# Patient Record
Sex: Male | Born: 1985 | Race: Black or African American | Hispanic: No | Marital: Single | Smoking: Former smoker
Health system: Southern US, Community
[De-identification: ages and names within clinical notes are randomized; demographics above are authoritative.]

## PROBLEM LIST (undated history)

## (undated) DIAGNOSIS — R0989 Other specified symptoms and signs involving the circulatory and respiratory systems: Secondary | ICD-10-CM

## (undated) DIAGNOSIS — I38 Endocarditis, valve unspecified: Secondary | ICD-10-CM

## (undated) HISTORY — PX: OTHER SURGICAL HISTORY: SHX169

---

## 2008-02-01 ENCOUNTER — Emergency Department (HOSPITAL_COMMUNITY): Admission: EM | Admit: 2008-02-01 | Discharge: 2008-02-01 | Payer: Self-pay | Admitting: Emergency Medicine

## 2009-08-31 ENCOUNTER — Emergency Department (HOSPITAL_COMMUNITY): Admission: EM | Admit: 2009-08-31 | Discharge: 2009-08-31 | Payer: Self-pay | Admitting: Emergency Medicine

## 2009-08-31 ENCOUNTER — Encounter: Payer: Self-pay | Admitting: Pulmonary Disease

## 2009-09-17 ENCOUNTER — Ambulatory Visit: Payer: Self-pay | Admitting: Pulmonary Disease

## 2009-09-17 DIAGNOSIS — R0609 Other forms of dyspnea: Secondary | ICD-10-CM

## 2009-09-17 DIAGNOSIS — R0989 Other specified symptoms and signs involving the circulatory and respiratory systems: Secondary | ICD-10-CM

## 2010-07-05 NOTE — Assessment & Plan Note (Signed)
Summary: consult for dyspnea, palpitations, weakness.   Copy to:  Duane Banks Primary Provider/Referring Provider:  Sigmund Banks  CC:  Pulmonary Consult , pt c/o sob and weakness, and some relief with pro-air.  History of Present Illness: The pt is a 25 y/o male who I have been asked to see for dyspnea, weakness, and palpitations.  The pt gives a two week history of "episodes" which consist of palpitations/irregular heartbeats, sob, and overwhelming feeling of weakness to the point he does not even want to stand up.  These have occurred everyday, and multiple times each day since they started.  Normally, he has no dyspnea whatsoever.  He was seen by his primary md, who thought it may related to asthma.  He has a h/o childhood asthma, but has had no issues with it since entering teenage years.  He was put on prednisone, as well as proair and flovent for the last one week.  He tells me that his breathing may be a little better, but has had no change in his palpitations and weakness.  The pt has been seen in the ER, where a cxr was unremarkable.  He denies any pleuritic chest pain or LE edema.  He has had no cough, congestion, or mucus.  Preventive Screening-Counseling & Management  Alcohol-Tobacco     Smoking Status: quit     Packs/Day: 4 cigarettes per day x 4 yrs     Year Started: 2007     Year Quit: 2011  Current Medications (verified): 1)  Aspirin 81 Mg Tabs (Aspirin) .Marland Kitchen.. 1 Once Daily 2)  Flovent Hfa 44 Mcg/act Aero (Fluticasone Propionate  Hfa) .... 2 Puffs Two Times A Day 3)  Tylenol 325 Mg Tabs (Acetaminophen) .Marland Kitchen.. 1 As Needed 4)  Proair Hfa 108 (90 Base) Mcg/act Aers (Albuterol Sulfate) .... 2 Puffs Every 4 Hrs As Needed  Allergies (verified): No Known Drug Allergies  Past History:  Past Medical History: Hypertension Headaches  Past Surgical History: none per pt  Family History: Reviewed history and no changes required. Maternal grandmother heart disease  Social  History: Reviewed history and no changes required. Single with 1 child Unemployed Former smoker quit 1 month ago Smoked marijuana x 2 years quit 2008 Alcohol 2 drinks per day Smoking Status:  quit Packs/Day:  4 cigarettes per day x 4 yrs  Review of Systems       The patient complains of shortness of breath with activity, shortness of breath at rest, chest pain, irregular heartbeats, acid heartburn, loss of appetite, weight change, sore throat, headaches, nasal congestion/difficulty breathing through nose, and anxiety.  The patient denies productive cough, non-productive cough, coughing up blood, indigestion, abdominal pain, difficulty swallowing, tooth/dental problems, sneezing, itching, ear ache, depression, hand/feet swelling, joint stiffness or pain, rash, change in color of mucus, and fever.    Vital Signs:  Patient profile:   25 year old male Height:      75 inches Weight:      212 pounds BMI:     26.59 O2 Sat:      97 % on Room air Temp:     98.4 degrees F oral Pulse rate:   83 / minute BP sitting:   110 / 70  (left arm)  Vitals Entered By: Renold Genta RCP, LPN (September 17, 2009 2:05 PM)  O2 Sat at Rest %:  97% O2 Flow:  Room air CC: Pulmonary Consult , pt c/o sob and weakness, some relief with pro-air Comments Medications reviewed with  patient Renold Genta RCP, LPN  September 17, 2009 2:05 PM    Physical Exam  General:  wd male in nad Eyes:  PERRLA and EOMI.   Nose:  patent without discharge Mouth:  clear, no exudates Neck:  no jvd, tmg , LN Lungs:  totally clear to auscultation Heart:  rrr, no mrg Abdomen:  soft and nontender, bs+ Extremities:  no edema or cyanosis pulses intact distally Neurologic:  alert and oriented, moves all 4.   Impression & Recommendations:  Problem # 1:  OTHER DYSPNEA AND RESPIRATORY ABNORMALITIES (ICD-786.09)  I am unable to explain the pt's symptoms currently.  His chest is clear to auscultation, his spirometry is normal, and his  cxr 2 weeks ago was totally clear.  A lot of his symptoms are nonpulmonary, and therefore not related to asthma.  I am unable to tell him with 100% certainty that he does or does not have asthma, but I think is unlikely.  He recently was treated with a course of prednisone, and has been taking flovent intermittantly.  This can convert reversible airflow obstruction to normal.  I think he needs to come off all pulmonary meds except as needed proair.  If he develops worsening sob, can recheck spirometry off meds to verify whether he has airflow obstruction.  Would consider whether to check echo and holter for completeness, given that his symptoms sound more cardiac than pulmonary.  The other consideration is whether he may be having occult reflux or anxiety driving all of this.  Will leave this investigation to his primary MD.  Medications Added to Medication List This Visit: 1)  Celexa 10 Mg Tabs (Citalopram hydrobromide) .Marland Kitchen.. 1 once daily  Other Orders: Consultation Level IV (45409) Spirometry w/Graph (81191)  Patient Instructions: 1)  stop flovent, can use proair as needed. 2)  if your breathing worsens off all meds, I am happy to get you into the office to repeat your spirometry. 3)  stop all smoking 100%. 4)  will send a note to your primary outlining my findiings.   [facility] Spirometry  ID: [id number] [name], Duane Banks DOB: [birth year] Age: 25 Years Old Sex: Male Race: Black Height: 75 Weight: 212 Smoker: No PPD: 4 cigarettes per day x 4 yrs Status: Unconfirmed Recorded: 09/17/2009 2:53 PM  Parameter  Measured Predicted %Predicted FVC     4.97        5.46        91.10 FEV1     4.19        4.60        91.20 FEV1%   84.29        85.22        98.90 PEF    10.60        11.19        94.70   Interpretation: spirometry is normal

## 2010-08-28 LAB — POCT CARDIAC MARKERS: CKMB, poc: <1 ng/mL — ABNORMAL LOW (ref 1.0–8.0)

## 2010-08-28 LAB — CBC
HCT: 48.7 % (ref 39.0–52.0)
Hemoglobin: 16.2 g/dL (ref 13.0–17.0)
MCV: 81.9 fL (ref 78.0–100.0)
Platelets: 252 10*3/uL (ref 150–400)
RDW: 14.6 % (ref 11.5–15.5)

## 2010-08-28 LAB — BASIC METABOLIC PANEL
BUN: 9 mg/dL (ref 6–23)
CO2: 25 mEq/L (ref 19–32)
GFR calc Af Amer: >60 mL/min (ref >60)
GFR calc non Af Amer: >60 mL/min (ref >60)
Glucose, Bld: 108 mg/dL — ABNORMAL HIGH (ref 70–99)

## 2011-01-12 ENCOUNTER — Emergency Department (HOSPITAL_COMMUNITY): Payer: Self-pay

## 2011-01-12 ENCOUNTER — Emergency Department (HOSPITAL_COMMUNITY)
Admission: EM | Admit: 2011-01-12 | Discharge: 2011-01-13 | Disposition: A | Discharge status: Home / Self Care | Payer: Self-pay | Attending: Emergency Medicine | Admitting: Emergency Medicine

## 2011-01-12 DIAGNOSIS — S62639B Displaced fracture of distal phalanx of unspecified finger, initial encounter for open fracture: Secondary | ICD-10-CM | POA: Insufficient documentation

## 2011-01-12 DIAGNOSIS — S61209A Unspecified open wound of unspecified finger without damage to nail, initial encounter: Secondary | ICD-10-CM | POA: Insufficient documentation

## 2011-01-12 DIAGNOSIS — W230XXA Caught, crushed, jammed, or pinched between moving objects, initial encounter: Secondary | ICD-10-CM | POA: Insufficient documentation

## 2011-08-06 ENCOUNTER — Ambulatory Visit (INDEPENDENT_AMBULATORY_CARE_PROVIDER_SITE_OTHER): Payer: BC Managed Care – PPO | Admitting: Internal Medicine

## 2011-08-06 VITALS — BP 128/78 | HR 61 | Temp 98.4°F | Resp 16 | Ht 75.0 in | Wt 198.0 lb

## 2011-08-06 DIAGNOSIS — H109 Unspecified conjunctivitis: Secondary | ICD-10-CM

## 2011-08-06 DIAGNOSIS — H1030 Unspecified acute conjunctivitis, unspecified eye: Secondary | ICD-10-CM

## 2011-08-06 MED ORDER — CIPROFLOXACIN HCL 0.3 % OP SOLN: 2.0000 [drp] | OPHTHALMIC | Status: AC

## 2011-08-06 NOTE — Progress Notes (Signed)
  Subjective:    Patient ID: Duane Banks, male    DOB: [birth year], 26 y.o.   MRN: [id number]  HPI Red eye on right 3 days itches some crusty d/c in am. No foreighn body. No contacts. No visual disturbance   Review of Systems  Constitutional: Negative.   HENT: Negative.   Eyes: Positive for discharge, redness and itching.  Respiratory: Negative.   Cardiovascular: Negative.   Gastrointestinal: Negative.   Genitourinary: Negative.   Musculoskeletal: Negative.   Skin: Negative.   Neurological: Negative.   Hematological: Negative.   Psychiatric/Behavioral: Negative.        Objective:   Physical Exam  Constitutional: He is oriented to person, place, and time. He appears well-developed and well-nourished.  HENT:  Head: Normocephalic and atraumatic.       Conjunctivitis r eye  Neck: Normal range of motion.  Cardiovascular: Normal rate and normal heart sounds.   Pulmonary/Chest: Effort normal and breath sounds normal.  Abdominal: Soft. Bowel sounds are normal.  Musculoskeletal: Normal range of motion.  Neurological: He is alert and oriented to person, place, and time.  Skin: Skin is warm and dry.  Psychiatric: He has a normal mood and affect. His behavior is normal. Judgment and thought content normal.          Assessment & Plan:  Conjunctivitis r eye...ciloxin and public health issues

## 2011-08-08 ENCOUNTER — Ambulatory Visit (INDEPENDENT_AMBULATORY_CARE_PROVIDER_SITE_OTHER): Payer: BC Managed Care – PPO | Admitting: Family Medicine

## 2011-08-08 DIAGNOSIS — H109 Unspecified conjunctivitis: Secondary | ICD-10-CM

## 2011-08-08 DIAGNOSIS — H00039 Abscess of eyelid unspecified eye, unspecified eyelid: Secondary | ICD-10-CM

## 2011-08-08 DIAGNOSIS — H5710 Ocular pain, unspecified eye: Secondary | ICD-10-CM

## 2011-08-08 DIAGNOSIS — H5712 Ocular pain, left eye: Secondary | ICD-10-CM

## 2011-08-08 DIAGNOSIS — L03213 Periorbital cellulitis: Secondary | ICD-10-CM

## 2011-08-08 LAB — POCT CBC
HCT, POC: 43.4 % — AB (ref 43.5–53.7)
Lymph, poc: 2.4 (ref 0.6–3.4)
Platelet Count, POC: 275 10*3/uL (ref 142–424)
RBC: 5.57 M/uL (ref 4.69–6.13)
RDW, POC: 14.7 %
WBC: 5 10*3/uL (ref 4.6–10.2)

## 2011-08-08 MED ORDER — AMOXICILLIN 875 MG PO TABS: 875.0000 mg | ORAL_TABLET | Freq: Two times a day (BID) | ORAL | Status: AC

## 2011-08-08 MED ORDER — SULFAMETHOXAZOLE-TRIMETHOPRIM 800-160 MG PO TABS: 1.0000 | ORAL_TABLET | Freq: Two times a day (BID) | ORAL | Status: AC

## 2011-08-08 NOTE — Progress Notes (Signed)
Subjective:    Patient ID: Duane Banks, male    DOB: 01/05/1986, 26 y.o.   MRN: [id number]  HPI Duane Banks is a 26 y.o. male Diagnosed with R eye conjunctivitis 2 days ago - started on Ciloxan gtts.  Just red then., but now more swelling around eye in am.  Irritated and red only  initailly, but eye swollen yesterday am.   using ciloxan gtts every 2 hours yesterday.  Less sore, but more swollen.  No known fever past few days.  Lump in front of R ear.  Cold symptoms over past week.  Congestion and runny nose. Blurry in R eye, past couple of days.  No known FB or scratching of eye.    Tx: otc congestion/cough medicine. +sick contact with pink eye 2 weeks ago. Cigars occasionally.    Review of Systems  Constitutional: Negative for fever and chills.  HENT: Positive for congestion and rhinorrhea. Negative for ear pain and ear discharge.   Eyes: Positive for pain, discharge, redness and visual disturbance.       Watery with green mucus, during day as well. Past few days.  Neurological: Positive for headaches.  Hematological: Positive for adenopathy.       In front of R ear.       Objective:   Physical Exam  Constitutional: He is oriented to person, place, and time. He appears well-developed and well-nourished. No distress.  HENT:  Head: Normocephalic and atraumatic.    Right Ear: Hearing, tympanic membrane, external ear and ear canal normal.  Left Ear: Hearing, tympanic membrane, external ear and ear canal normal.       R preauricular node ttp/enlarged.  Eyes: EOM are normal. Pupils are equal, round, and reactive to light. Right eye exhibits discharge. Right eye exhibits no hordeolum. No foreign body present in the right eye. Left eye exhibits no discharge and no exudate. Right conjunctiva is injected. Left conjunctiva is not injected.       Injected R sclera with slight mucopurulent d/c at [place].  Anterior chamber clear.  Upper and lower lids edematous/erythematous, with R  infraorbital erythema.  EOM unrestricted and not painful.   See photo.  Neck: Neck supple.  Cardiovascular: Normal rate and intact distal pulses.   No murmur heard. Pulmonary/Chest: Effort normal.  Lymphadenopathy:    He has no cervical adenopathy.  Neurological: He is oriented to person, place, and time.  Skin: Skin is warm and dry.  Psychiatric: He has a normal mood and affect. His behavior is normal.   Results for orders placed in visit on 08/08/11  POCT CBC      Component Value Range   WBC 5.0  4.6 - 10.2 (K/uL)   Lymph, poc 2.4  0.6 - 3.4    POC LYMPH PERCENT 47.8  10 - 50 (%L)   MID (cbc) 0.3  0 - 0.9    POC MID % 6.7  0 - 12 (%M)   POC Granulocyte 2.3  2 - 6.9    Granulocyte percent 45.5  37 - 80 (%G)   RBC 5.57  4.69 - 6.13 (M/uL)   Hemoglobin 14.2  14.1 - 18.1 (g/dL)   HCT, POC 40.9 (*) 81.1 - 53.7 (%)   MCV 77.9 (*) 80 - 97 (fL)   MCH, POC 25.5 (*) 27 - 31.2 (pg)   MCHC 32.7  31.8 - 35.4 (g/dL)   RDW, POC 91.4     Platelet Count, POC 275  142 - 424 (  K/uL)   MPV 8.9  0 - 99.8 (fL)         Assessment & Plan:  R conjunctivitis with probable secondary preseptal cellulitis.  Reassuring CBC,  And extraocular muscle exam, doubt orbital cellulitis.  visual acuity equal. Culture obtained of discharge at [place]. Continue ciloxan gtts q 2 hours. Start septra ds bid #20 Start amoxicillin 875mg  BID # 20. Recheck tomorrow morning.  If any worsening, or worsening sooner, consider orbital CT.

## 2011-08-08 NOTE — Patient Instructions (Signed)
Continue eye drops as previously prescribed.  Start nboth new antibiotics today.  Recheck with me tomorrow morning  Return to the clinic or go to the nearest emergency room if any of your symptoms worsen or new symptoms occur.

## 2011-08-09 ENCOUNTER — Ambulatory Visit (INDEPENDENT_AMBULATORY_CARE_PROVIDER_SITE_OTHER): Payer: BC Managed Care – PPO | Admitting: Family Medicine

## 2011-08-09 VITALS — BP 96/54 | HR 73 | Temp 98.5°F | Resp 16 | Ht 74.0 in | Wt 194.2 lb

## 2011-08-09 DIAGNOSIS — H5711 Ocular pain, right eye: Secondary | ICD-10-CM

## 2011-08-09 DIAGNOSIS — H5710 Ocular pain, unspecified eye: Secondary | ICD-10-CM

## 2011-08-09 DIAGNOSIS — H109 Unspecified conjunctivitis: Secondary | ICD-10-CM

## 2011-08-09 NOTE — Patient Instructions (Signed)
Your eye infection is possibly due to a virus, which will improve with symptomatic care and time.  However, to cover for possible bacteria, can continue antibiotic drops, and amoxicillin.  Stop Septra at this point.  If not improving in next 2 days, return for recheck as may need to be evaluated by ophthalmologist.  Return to the clinic or go to the nearest emergency room if any of your symptoms worsen or new symptoms occur.

## 2011-08-09 NOTE — Progress Notes (Signed)
  Subjective:    Patient ID: AIDYN KELLIS, male    DOB: 1985/12/30, 26 y.o.   MRN: [id number]  Saladin Petrelli Bubb is a 26 y.o. male  Diagnosed with R eye conjunctivitis 3 days ago - started on Ciloxan gtts.  Initially just red eye, but more swelling around eye past 2 days.  Seen yesterday - dx with early preseptal cellulitis.  Continued Ciloxan gtts., added Septra, and Amoxicillin.   Here for follow up.Eye still swollen, no fever, tolerating antibiotics.  S/p 3 doses.  Redness persisted around an in eye.  Not much change. Less irritation with drops only.   Review of Systems  Constitutional: Negative for fever and chills.  HENT: Positive for facial swelling and rhinorrhea. Negative for hearing loss, ear pain and ear discharge.        Feels like R side of face near eye is swollen after OV yesterday.   Eyes: Positive for discharge and redness. Negative for photophobia, pain and itching.       Watery, with slight matting this am, no green d/c noted today.  Slight glare on right eye since onset of eye sx's.  Hematological: Positive for adenopathy.       Objective:   Physical Exam  Nursing note and vitals reviewed. Constitutional: He appears well-developed and well-nourished.  HENT:  Head: Normocephalic and atraumatic.  Right Ear: External ear normal.  Left Ear: External ear normal.  Eyes: EOM are normal. Pupils are equal, round, and reactive to light. Right conjunctiva is injected.       Superior and inferior R lid edema/erythema with small amount lateral sts (appears same as yesterday)  Slight matting on lids, no discharge noted.  No proptosis, no hyphema- anterior chamber clear.  R preauricular node enlarged.  Pulmonary/Chest: Effort normal.  Skin: Skin is warm and dry. There is erythema.  Psychiatric: He has a normal mood and affect. His behavior is normal.    2nd MD exam performed.    Assessment & Plan:   DARON STUTZ is a 26 y.o. male Conjunctivitis, with probable  keratoconjunctivitis.  uri sx's of congestion/rhinorhea, afebrile, and normal CBC yesterday, viral etiology probable - including Adnenovirus with preauricular lymphadenopathy.   Ok to continue ciloxan and amoxicillin, but can stop septra.  Cont sx care with compresses prn, tylenol prn, oow x 2 days if needed. If not improved in 48 hours, may need optho eval.

## 2011-08-11 ENCOUNTER — Ambulatory Visit (INDEPENDENT_AMBULATORY_CARE_PROVIDER_SITE_OTHER): Payer: BC Managed Care – PPO | Admitting: Family Medicine

## 2011-08-11 VITALS — BP 108/69 | HR 81 | Temp 99.0°F | Resp 16 | Ht 74.0 in | Wt 194.0 lb

## 2011-08-11 DIAGNOSIS — H5789 Other specified disorders of eye and adnexa: Secondary | ICD-10-CM

## 2011-08-11 DIAGNOSIS — H539 Unspecified visual disturbance: Secondary | ICD-10-CM

## 2011-08-11 DIAGNOSIS — H5710 Ocular pain, unspecified eye: Secondary | ICD-10-CM

## 2011-08-11 DIAGNOSIS — H2000 Unspecified acute and subacute iridocyclitis: Secondary | ICD-10-CM

## 2011-08-11 DIAGNOSIS — H5711 Ocular pain, right eye: Secondary | ICD-10-CM

## 2011-08-11 NOTE — Progress Notes (Signed)
  Subjective:    Patient ID: Duane Banks, male    DOB: [birth year], 26 y.o.   MRN: [id number]  HPI With continued right eye redness, pain and leg swelling. Seen initially on 08/06/2011 and diagnosed with bacterial conjunctivitis. Started on Ciloxandrops. He developed swelling of the lids of the right eye and returned on 08/08/2011 where he was diagnosed with early preseptal cellulitis and Septra and amoxicillin oral tablets were added. He returned for reevaluation on 08/09/2011 with worsening swelling of the lid. Septra was discontinued. He was advised to return in 48 hours if he had not had significant improvement. He returns today reporting minimal decrease in the swelling and irritation. I do still red. Now he is also having irritation and developing redness in the left eye. There gave him a hepatic drop that contains belladonna-he used it yesterday but isn't sure if it may change in his symptoms. Notes that his vision seems darker on the right.   Review of Systems As above.    Objective:   Physical Exam Vital signs are noted. Vision has now decreased in the right eye. He remained stable on the left.sclera and conjunctiva are injected. The lid is swollen but without significant erythema. There is crusting on the lashes. Pupil is equal, round and reactive to light. Extraocular movements are intact. No hyphema. The anterior chamber is clear. Funduscopic exam is unremarkable. No palpable lymphadenopathy-he had preauricular lymphadenopathy on previous exams.       Assessment & Plan:  Eye pain, redness, decreased visual acuity, iritis.  To see ophthalmology today.  Evaluated with Dr. Hal Hope.

## 2011-08-11 NOTE — Patient Instructions (Addendum)
Continue the Ciloxan eye drops. Proceed to the appointment with the ophthalmologist today. Dr. Laruth Bouchard office is on [facility] [address] across from the visitor parking deck at [facility].

## 2012-04-24 ENCOUNTER — Emergency Department (HOSPITAL_COMMUNITY)
Admission: EM | Admit: 2012-04-24 | Discharge: 2012-04-24 | Disposition: A | Discharge status: Home / Self Care | Payer: BC Managed Care – PPO | Attending: Emergency Medicine | Admitting: Emergency Medicine

## 2012-04-24 ENCOUNTER — Encounter (HOSPITAL_COMMUNITY): Payer: Self-pay | Admitting: Physical Medicine and Rehabilitation

## 2012-04-24 DIAGNOSIS — A64 Unspecified sexually transmitted disease: Secondary | ICD-10-CM | POA: Insufficient documentation

## 2012-04-24 DIAGNOSIS — Z87891 Personal history of nicotine dependence: Secondary | ICD-10-CM | POA: Insufficient documentation

## 2012-04-24 DIAGNOSIS — R300 Dysuria: Secondary | ICD-10-CM | POA: Insufficient documentation

## 2012-04-24 LAB — URINE MICROSCOPIC-ADD ON

## 2012-04-24 LAB — URINALYSIS, ROUTINE W REFLEX MICROSCOPIC
Bilirubin Urine: NEGATIVE
Glucose, UA: NEGATIVE mg/dL
Ketones, ur: NEGATIVE mg/dL
Protein, ur: NEGATIVE mg/dL
Urobilinogen, UA: 1 mg/dL (ref 0.0–1.0)

## 2012-04-24 MED ORDER — LIDOCAINE HCL (PF) 1 % IJ SOLN
INTRAMUSCULAR | Status: AC
  Filled 2012-04-24: qty 5

## 2012-04-24 MED ORDER — PHENAZOPYRIDINE HCL 200 MG PO TABS: 200.0000 mg | ORAL_TABLET | Freq: Three times a day (TID) | ORAL | Status: DC | PRN

## 2012-04-24 MED ORDER — AZITHROMYCIN 1 G PO PACK
1.0000 g | PACK | Freq: Once | ORAL | Status: AC
  Administered 2012-04-24: 1 g via ORAL
  Filled 2012-04-24: qty 1

## 2012-04-24 MED ORDER — CEFTRIAXONE SODIUM 250 MG IJ SOLR
250.0000 mg | Freq: Once | INTRAMUSCULAR | Status: AC
  Administered 2012-04-24: 250 mg via INTRAMUSCULAR
  Filled 2012-04-24: qty 250

## 2012-04-24 NOTE — ED Notes (Signed)
States last week his ex-girlfriend told him she had Chlamydia-- started having penile d/c Saturday.

## 2012-04-24 NOTE — ED Notes (Signed)
Pt presents to department for evaluation of dysuria and yellow colored penile discharge. Ongoing x4 days. Pt concerned that he has STD. No other complaints. He is alert and oriented x4. No signs of distress.

## 2012-04-24 NOTE — ED Provider Notes (Signed)
History    This chart was scribed for Cheri Guppy, MD, MD by Smitty Pluck, ED Scribe. The patient was seen in room TR10C and the patient's care was started at 2:53PM.   CSN: [id number]  Arrival date & time 04/24/12  1410   None     Chief Complaint  Patient presents with  . Urinary Frequency  . SEXUALLY TRANSMITTED DISEASE    (Consider location/radiation/quality/duration/timing/severity/associated sxs/prior treatment) Patient is a 26 y.o. male presenting with frequency. The history is provided by the patient. No language interpreter was used.  Urinary Frequency Pertinent negatives include no shortness of breath.   Duane Banks is a 26 y.o. male who presents to the Emergency Department complaining of constant, moderate penile discharge onset 4 days ago. Discharge is yellowish. Reports dysuria. Pt reports that he has had chlamydia in the past. He reports that his ex-girlfriend told him she was diagnosed with chlamydia. He has not had sexual contact with her in weeks. Pt denies fever, chills, nausea, vomiting and any other pain.     No past medical history on file.  No past surgical history on file.  History reviewed. No pertinent family history.  History  Substance Use Topics  . Smoking status: Former Smoker    Types: Cigars    Quit date: 08/05/2008  . Smokeless tobacco: Not on file  . Alcohol Use: Yes      Review of Systems  Constitutional: Negative for fever and chills.  Respiratory: Negative for shortness of breath.   Gastrointestinal: Negative for nausea and vomiting.  Genitourinary: Positive for dysuria and discharge.  Neurological: Negative for weakness.  All other systems reviewed and are negative.    Allergies  Review of patient's allergies indicates no known allergies.  Home Medications  No current outpatient prescriptions on file.  BP 145/59  Pulse 85  Temp 97.4 F (36.3 C) (Oral)  Resp 16  SpO2 98%  Physical Exam  Nursing note and  vitals reviewed. Constitutional: He is oriented to person, place, and time. He appears well-developed and well-nourished. No distress.  HENT:  Head: Normocephalic and atraumatic.  Eyes: EOM are normal.  Neck: Neck supple. No tracheal deviation present.  Cardiovascular: Normal rate.   Pulmonary/Chest: Effort normal. No respiratory distress.  Genitourinary: No penile tenderness.       No palpable lymphadenopathy Purulent discharge  Musculoskeletal: Normal range of motion.  Neurological: He is alert and oriented to person, place, and time.  Skin: Skin is warm and dry.  Psychiatric: He has a normal mood and affect. His behavior is normal.    ED Course  Procedures (including critical care time) DIAGNOSTIC STUDIES: Oxygen Saturation is 98% on room air, normal by my interpretation.    COORDINATION OF CARE: 2:57 PM Discussed ED treatment with pt      Labs Reviewed  URINALYSIS, ROUTINE W REFLEX MICROSCOPIC   No results found.   No diagnosis found.    MDM  Std Not immunocompromised No signs systemic illness.  Partner has already been treated      I personally performed the services described in this documentation, which was scribed in my presence. The recorded information has been reviewed and is accurate.    Cheri Guppy, MD 04/24/12 1500

## 2013-01-12 ENCOUNTER — Emergency Department (HOSPITAL_COMMUNITY)
Admission: EM | Admit: 2013-01-12 | Discharge: 2013-01-12 | Disposition: A | Discharge status: Home / Self Care | Payer: Self-pay | Attending: Emergency Medicine | Admitting: Emergency Medicine

## 2013-01-12 ENCOUNTER — Encounter (HOSPITAL_COMMUNITY): Payer: Self-pay | Admitting: Nurse Practitioner

## 2013-01-12 DIAGNOSIS — Z8679 Personal history of other diseases of the circulatory system: Secondary | ICD-10-CM | POA: Insufficient documentation

## 2013-01-12 DIAGNOSIS — R300 Dysuria: Secondary | ICD-10-CM | POA: Insufficient documentation

## 2013-01-12 DIAGNOSIS — N342 Other urethritis: Secondary | ICD-10-CM | POA: Insufficient documentation

## 2013-01-12 DIAGNOSIS — Z87891 Personal history of nicotine dependence: Secondary | ICD-10-CM | POA: Insufficient documentation

## 2013-01-12 HISTORY — DX: Endocarditis, valve unspecified: I38

## 2013-01-12 MED ORDER — CEFTRIAXONE SODIUM 250 MG IJ SOLR
250.0000 mg | Freq: Once | INTRAMUSCULAR | Status: AC
  Administered 2013-01-12: 250 mg via INTRAMUSCULAR
  Filled 2013-01-12: qty 250

## 2013-01-12 MED ORDER — AZITHROMYCIN 250 MG PO TABS
1000.0000 mg | ORAL_TABLET | Freq: Once | ORAL | Status: AC
  Administered 2013-01-12: 1000 mg via ORAL
  Filled 2013-01-12: qty 4

## 2013-01-12 NOTE — ED Provider Notes (Signed)
  CSN: [id number]     Arrival date & time 01/12/13  1209 History     First MD Initiated Contact with Patient 01/12/13 1221     Chief Complaint  Patient presents with  . Exposure to STD   (Consider location/radiation/quality/duration/timing/severity/associated sxs/prior Treatment) HPI  BLAINE HARI is a 27 y.o. male venting for STD evaluation. Patient heard from a mutual friend that one of his sexual contacts had unknown STD thinks it might be Chlamydia. Patient does endorse a urethritis with thin clear discharge and very mild dysuria over the last 2 weeks. He denies fever, abdominal pain, testicular pain or swelling.  Past Medical History  Diagnosis Date  . Heart valve regurgitation    History reviewed. No pertinent past surgical history. History reviewed. No pertinent family history. History  Substance Use Topics  . Smoking status: Former Smoker    Types: Cigars    Quit date: 08/05/2008  . Smokeless tobacco: Not on file  . Alcohol Use: Yes    Review of Systems 10 systems reviewed and found to be negative, except as noted in the HPI   Allergies  Review of patient's allergies indicates no known allergies.  Home Medications  No current outpatient prescriptions on file. BP 148/64  Pulse 66  Temp(Src) 98.9 F (37.2 C) (Oral)  Resp 16  SpO2 100% Physical Exam  Nursing note and vitals reviewed. Constitutional: He is oriented to person, place, and time. He appears well-developed and well-nourished. No distress.  HENT:  Head: Normocephalic.  Eyes: Conjunctivae and EOM are normal.  Cardiovascular: Normal rate.   Pulmonary/Chest: Effort normal. No stridor.  Genitourinary:  GU exam chaperoned by nurse.  No rashes or lesions, no testicular  pain or swelling.  Musculoskeletal: Normal range of motion.  Neurological: He is alert and oriented to person, place, and time.  Psychiatric: He has a normal mood and affect.    ED Course   Procedures (including critical care  time)  Labs Reviewed  GC/CHLAMYDIA PROBE AMP   No results found. 1. Urethritis     MDM   Filed Vitals:   01/12/13 1213  BP: 148/64  Pulse: 66  Temp: 98.9 F (37.2 C)  TempSrc: Oral  Resp: 16  SpO2: 100%     RAEVON BROOM is a 27 y.o. male with urethritis and STD exposure. Pt treated and tested for GC/Chlamydia, advised full DTD screen and to refrain from sex until that time. PE shows no abnormalities   Medications  cefTRIAXone (ROCEPHIN) injection 250 mg (250 mg Intramuscular Given 01/12/13 1410)  azithromycin (ZITHROMAX) tablet 1,000 mg (1,000 mg Oral Given 01/12/13 1408)    Pt is hemodynamically stable, appropriate for, and amenable to discharge at this time. Pt verbalized understanding and agrees with care plan. All questions answered. Outpatient follow-up and specific return precautions discussed.    Note: Portions of this report may have been transcribed using voice recognition software. Every effort was made to ensure accuracy; however, inadvertent computerized transcription errors may be present    Wynetta Emery, PA-C 01/13/13 0940

## 2013-01-12 NOTE — ED Notes (Signed)
Pt states a sexual contact told him she was treated for [facility] and he is "feeling funny down there." also c/o feeling a "knot in my chest, under my skin."

## 2013-01-13 NOTE — ED Provider Notes (Signed)
Medical screening examination/treatment/procedure(s) were performed by non-physician practitioner and as supervising physician I was immediately available for consultation/collaboration.  Merick Kelleher L Nayellie Sanseverino, MD 01/13/13 1034 

## 2013-01-14 LAB — GC/CHLAMYDIA PROBE AMP: GC Probe RNA: NEGATIVE

## 2013-01-15 NOTE — ED Notes (Signed)
+   chlamydia Patient treated with Rocephin and Zithromax-[facility]- letter faxed-

## 2013-01-17 ENCOUNTER — Telehealth (HOSPITAL_COMMUNITY): Payer: Self-pay | Admitting: *Deleted

## 2013-01-17 NOTE — ED Notes (Signed)
Patient informed of positive results after id'd x 2 and informed of need to notify partner to be treated. 

## 2013-07-14 ENCOUNTER — Emergency Department (HOSPITAL_BASED_OUTPATIENT_CLINIC_OR_DEPARTMENT_OTHER): Payer: Self-pay

## 2013-07-14 ENCOUNTER — Emergency Department (HOSPITAL_BASED_OUTPATIENT_CLINIC_OR_DEPARTMENT_OTHER)
Admission: EM | Admit: 2013-07-14 | Discharge: 2013-07-14 | Disposition: A | Discharge status: Home / Self Care | Payer: Self-pay | Attending: Emergency Medicine | Admitting: Emergency Medicine

## 2013-07-14 ENCOUNTER — Encounter (HOSPITAL_BASED_OUTPATIENT_CLINIC_OR_DEPARTMENT_OTHER): Payer: Self-pay | Admitting: Emergency Medicine

## 2013-07-14 DIAGNOSIS — R0602 Shortness of breath: Secondary | ICD-10-CM | POA: Insufficient documentation

## 2013-07-14 DIAGNOSIS — Z87891 Personal history of nicotine dependence: Secondary | ICD-10-CM | POA: Insufficient documentation

## 2013-07-14 DIAGNOSIS — R002 Palpitations: Secondary | ICD-10-CM | POA: Insufficient documentation

## 2013-07-14 DIAGNOSIS — R42 Dizziness and giddiness: Secondary | ICD-10-CM | POA: Insufficient documentation

## 2013-07-14 DIAGNOSIS — Z8679 Personal history of other diseases of the circulatory system: Secondary | ICD-10-CM | POA: Insufficient documentation

## 2013-07-14 LAB — CBC
HCT: 39.1 % (ref 39.0–52.0)
Hemoglobin: 13.4 g/dL (ref 13.0–17.0)
MCH: 26.4 pg (ref 26.0–34.0)
MCHC: 34.3 g/dL (ref 30.0–36.0)
MCV: 77 fL — AB (ref 78.0–100.0)
Platelets: 230 10*3/uL (ref 150–400)
RBC: 5.08 MIL/uL (ref 4.22–5.81)
RDW: 13.5 % (ref 11.5–15.5)
WBC: 5.8 10*3/uL (ref 4.0–10.5)

## 2013-07-14 LAB — BASIC METABOLIC PANEL
BUN: 14 mg/dL (ref 6–23)
CHLORIDE: 101 meq/L (ref 96–112)
CO2: 25 meq/L (ref 19–32)
Calcium: 9.4 mg/dL (ref 8.4–10.5)
Creatinine, Ser: 1.1 mg/dL (ref 0.50–1.35)
GFR calc Af Amer: >90 mL/min (ref >90)
GFR calc non Af Amer: >90 mL/min (ref >90)
Glucose, Bld: 101 mg/dL — ABNORMAL HIGH (ref 70–99)
POTASSIUM: 3.9 meq/L (ref 3.7–5.3)
SODIUM: 141 meq/L (ref 137–147)

## 2013-07-14 LAB — TROPONIN I

## 2013-07-14 NOTE — Discharge Instructions (Signed)
Palpitations  A palpitation is the feeling that your heartbeat is irregular. It may feel like your heart is fluttering or skipping a beat. It may also feel like your heart is beating faster than normal. This is usually not a serious problem. In some cases, you may need more medical tests. HOME CARE  Avoid:  Caffeine in coffee, tea, soft drinks, diet pills, and energy drinks.  Chocolate.  Alcohol.  Stop smoking if you smoke.  Reduce your stress and anxiety. Try:  A method that measures bodily functions so you can learn to control them (biofeedback).  Yoga.  Meditation.  Physical activity such as swimming, jogging, or walking.  Get plenty of rest and sleep. GET HELP RIGHT AWAY IF:   You have chest pain.  You feel short of breath.  You have a very bad headache.  You feel dizzy or pass out (faint).  Your fast or irregular heartbeat continues after 24 hours.  Your palpitations occur more often. MAKE SURE YOU:   Understand these instructions.  Will watch your condition.  Will get help right away if you are not doing well or get worse. Document Released: 02/29/2008 Document Revised: 11/21/2011 Document Reviewed: 07/21/2011 [employer] Patient Information 2014 [employer].  

## 2013-07-14 NOTE — ED Provider Notes (Signed)
CSN: [id number]     Arrival date & time 07/14/13  1617 History   First MD Initiated Contact with Patient 07/14/13 1629     Chief Complaint  Patient presents with  . Palpitations     (Consider location/radiation/quality/duration/timing/severity/associated sxs/prior Treatment) HPI Comments: Pt states that the symptoms lasted for a couple of minutes:similar history in the past and was told that he had a "valve problem": pt states that he used to use marijuana but doesn't any more  Patient is a 28 y.o. male presenting with palpitations. The history is provided by the patient. No language interpreter was used.  Palpitations Palpitations quality:  Fast Onset quality:  Sudden Timing:  Constant Progression:  Resolved Chronicity:  Recurrent Context: not anxiety, not appetite suppressants, not caffeine, not exercise and not illicit drugs   Relieved by:  Nothing Worsened by:  Nothing tried Ineffective treatments:  None tried Associated symptoms: dizziness and shortness of breath   Associated symptoms: no nausea     Past Medical History  Diagnosis Date  . Heart valve regurgitation    History reviewed. No pertinent past surgical history. No family history on file. History  Substance Use Topics  . Smoking status: Former Smoker    Types: Cigars    Quit date: 08/05/2008  . Smokeless tobacco: Never Used  . Alcohol Use: 2.2 oz/week    2 Drinks containing 0.5 oz of alcohol, 2 Cans of beer per week    Review of Systems  Constitutional: Negative.   Respiratory: Positive for shortness of breath.   Cardiovascular: Positive for palpitations.  Gastrointestinal: Negative for nausea.  Neurological: Positive for dizziness.      Allergies  Review of patient's allergies indicates no known allergies.  Home Medications   Current Outpatient Rx  Name  Route  Sig  Dispense  Refill  . aspirin 325 MG tablet   Oral   Take 325 mg by mouth daily as needed.          BP 141/65  Pulse 74   Temp(Src) 98.9 F (37.2 C) (Oral)  Resp 18  Ht 6\' 3"  (1.905 m)  Wt 200 lb (90.719 kg)  BMI 25.00 kg/m2  SpO2 100% Physical Exam  Nursing note and vitals reviewed. Constitutional: He is oriented to person, place, and time. He appears well-developed and well-nourished.  HENT:  Head: Normocephalic and atraumatic.  Eyes: Conjunctivae and EOM are normal.  Cardiovascular: Normal rate and regular rhythm.   No murmur heard. Pulmonary/Chest: Effort normal and breath sounds normal.  Abdominal: Soft. Bowel sounds are normal. There is no tenderness.  Musculoskeletal: Normal range of motion.  Neurological: He is alert and oriented to person, place, and time.  Skin: Skin is warm.  Psychiatric: He has a normal mood and affect.    ED Course  Procedures (including critical care time) Labs Review Labs Reviewed  BASIC METABOLIC PANEL - Abnormal; Notable for the following:    Glucose, Bld 101 (*)    All other components within normal limits  CBC - Abnormal; Notable for the following:    MCV 77.0 (*)    All other components within normal limits  TROPONIN I   Imaging Review Dg Chest 2 View  07/14/2013   CLINICAL DATA:  Palpitations  EXAM: CHEST  2 VIEW  COMPARISON:  08/31/2009  FINDINGS: Cardiomediastinal silhouette is stable. Again noted mild scoliosis of thoracic spine. No acute infiltrate or pleural effusion. No pulmonary edema.  IMPRESSION: No active cardiopulmonary disease.   Electronically Signed  By: Natasha MeadLiviu  Pop M.D.   On: 07/14/2013 17:08    EKG Interpretation    Date/Time:  Monday July 14 2013 16:31:21 EST Ventricular Rate:  73 PR Interval:  158 QRS Duration: 82 QT Interval:  356 QTC Calculation: 392 R Axis:   76 Text Interpretation:  Normal sinus rhythm Confirmed by Fayrene FearingJAMES  MD, MARK ([id number]) on 07/14/2013 4:36:51 PM            MDM   Final diagnoses:  Palpitations    Pt if feeling better at this time. Trop negative. Electrolytes normal. Pt given follow up back to  cardiology for return of symptoms    Teressa LowerVrinda Tavie Haseman, NP 07/14/13 [phone]

## 2013-07-14 NOTE — ED Notes (Signed)
Pt reports he had an episode of rapid heart beat around 345pm when getting off work. States he felt lightheaded, sob, neck tight and hands felt numb.States he feels a little uneasy now but sx have improved

## 2013-07-19 NOTE — ED Provider Notes (Signed)
Medical screening examination/treatment/procedure(s) were performed by non-physician practitioner and as supervising physician I was immediately available for consultation/collaboration.  EKG Interpretation    Date/Time:  Monday July 14 2013 16:31:21 EST Ventricular Rate:  73 PR Interval:  158 QRS Duration: 82 QT Interval:  356 QTC Calculation: 392 R Axis:   76 Text Interpretation:  Normal sinus rhythm Confirmed by Fayrene FearingJAMES  MD, Jori Frerichs ([id number]) on 07/14/2013 4:36:51 PM            I agree with the above EKG interpretation.  Rolland PorterMark Weslynn Ke, MD 07/19/13 [phone]

## 2013-09-04 ENCOUNTER — Emergency Department (HOSPITAL_BASED_OUTPATIENT_CLINIC_OR_DEPARTMENT_OTHER)
Admission: EM | Admit: 2013-09-04 | Discharge: 2013-09-04 | Disposition: A | Discharge status: Home / Self Care | Payer: Self-pay | Attending: Emergency Medicine | Admitting: Emergency Medicine

## 2013-09-04 ENCOUNTER — Encounter (HOSPITAL_BASED_OUTPATIENT_CLINIC_OR_DEPARTMENT_OTHER): Payer: Self-pay | Admitting: Emergency Medicine

## 2013-09-04 DIAGNOSIS — Y9361 Activity, american tackle football: Secondary | ICD-10-CM | POA: Insufficient documentation

## 2013-09-04 DIAGNOSIS — Z8679 Personal history of other diseases of the circulatory system: Secondary | ICD-10-CM | POA: Insufficient documentation

## 2013-09-04 DIAGNOSIS — Z7982 Long term (current) use of aspirin: Secondary | ICD-10-CM | POA: Insufficient documentation

## 2013-09-04 DIAGNOSIS — X500XXA Overexertion from strenuous movement or load, initial encounter: Secondary | ICD-10-CM | POA: Insufficient documentation

## 2013-09-04 DIAGNOSIS — S76319A Strain of muscle, fascia and tendon of the posterior muscle group at thigh level, unspecified thigh, initial encounter: Secondary | ICD-10-CM

## 2013-09-04 DIAGNOSIS — Z87891 Personal history of nicotine dependence: Secondary | ICD-10-CM | POA: Insufficient documentation

## 2013-09-04 DIAGNOSIS — IMO0002 Reserved for concepts with insufficient information to code with codable children: Secondary | ICD-10-CM | POA: Insufficient documentation

## 2013-09-04 DIAGNOSIS — Y92838 Other recreation area as the place of occurrence of the external cause: Secondary | ICD-10-CM

## 2013-09-04 DIAGNOSIS — Y9239 Other specified sports and athletic area as the place of occurrence of the external cause: Secondary | ICD-10-CM | POA: Insufficient documentation

## 2013-09-04 NOTE — Discharge Instructions (Signed)
Hamstring Strain  °Hamstrings are the large muscles in the back of the thighs. A strain or tear injury happens when there is a sudden stretch or pull on these muscles and tendons. Tendons are cord like structures that attach muscle to bone. These injuries are commonly seen in activities such as sprinting due to sudden acceleration.  °DIAGNOSIS  °Often the diagnosis can be made by examination. °HOME CARE INSTRUCTIONS  °· Apply ice to the sore area for 15-20minutes, 03-04 times per day. Do this while awake for the first 2 days. Put the ice in a plastic bag, and place a towel between the bag of ice and your skin. °· Keep your knee flexed when possible. This means your foot is held off the ground slightly if you are on crutches. When lying down, a pillow under the knee will take strain off the muscles and provide some relief. °· If a compression bandage such as an ace wrap was applied, use it until you are seen again. You may remove it for sleeping, showers and baths. If the wrap seems to be too tight and is uncomfortable, wrap it more loosely. If your toes or foot are getting cold or blue, it is too tight. °· Walk or move around as the pain allows, or as instructed. Resume full activities as suggested by your caregiver. This is often safest when the strength of the injured leg has nearly returned to normal. °· Only take over-the-counter or prescription medicines for pain, discomfort, or fever as directed by your caregiver. °SEEK MEDICAL CARE IF:  °· You have an increase in bruising, swelling or pain. °· You notice coldness or blueness of your toes or foot. °· Pain relief is not obtained with medications. °· You have increasing pain in the area and seem to be getting worse rather than better. °· You notice your thigh getting larger in size (this could indicate bleeding into the muscle). °Document Released: 02/14/2001 Document Revised: 08/14/2011 Document Reviewed: 05/24/2008 °[employer]® Patient Information ©2014  [employer]. ° °

## 2013-09-04 NOTE — ED Provider Notes (Signed)
Medical screening examination/treatment/procedure(s) were performed by non-physician practitioner and as supervising physician I was immediately available for consultation/collaboration.   EKG Interpretation None        Darrien Belter, MD 09/04/13 1702 

## 2013-09-04 NOTE — ED Provider Notes (Signed)
CSN: [id number]     Arrival date & time 09/04/13  1438 History   First MD Initiated Contact with Patient 09/04/13 1441     Chief Complaint  Patient presents with  . Leg Injury     (Consider location/radiation/quality/duration/timing/severity/associated sxs/prior Treatment) HPI Comments: Pt states that he was playing football and running and he felt a pop in the back of his right upper leg. Pt states that the area has continued to be sore. Has been taking ibuprofen with some mild relief. Denies problems with movement  The history is provided by the patient. No language interpreter was used.    Past Medical History  Diagnosis Date  . Heart valve regurgitation    History reviewed. No pertinent past surgical history. No family history on file. History  Substance Use Topics  . Smoking status: Former Games developermoker  . Smokeless tobacco: Never Used  . Alcohol Use: 0.0 oz/week    Review of Systems  Constitutional: Negative.   Respiratory: Negative.   Cardiovascular: Negative.       Allergies  Review of patient's allergies indicates no known allergies.  Home Medications   Current Outpatient Rx  Name  Route  Sig  Dispense  Refill  . ibuprofen (ADVIL,MOTRIN) 200 MG tablet   Oral   Take 200 mg by mouth every 6 (six) hours as needed.         Marland Kitchen. aspirin 325 MG tablet   Oral   Take 325 mg by mouth daily as needed.          BP 120/77  Pulse 63  Temp(Src) 99.2 F (37.3 C) (Oral)  Resp 16  SpO2 100% Physical Exam  Nursing note and vitals reviewed. Constitutional: He appears well-developed and well-nourished.  Cardiovascular: Normal rate and regular rhythm.   Pulmonary/Chest: Effort normal and breath sounds normal.  Musculoskeletal:  Tender to the right posterior leg. No swelling or deformity noted. Pt has full rom    ED Course  Procedures (including critical care time) Labs Review Labs Reviewed - No data to display Imaging Review No results found.   EKG  Interpretation None      MDM   Final diagnoses:  Hamstring strain    Discussed symptomatic treatment with pt.doubt bony abnormality   Teressa LowerVrinda Palma Buster, NP 09/04/13 1527

## 2013-09-04 NOTE — ED Notes (Signed)
Injured right leg playing football 5 days ago-pain to "hamstring"

## 2013-09-08 ENCOUNTER — Ambulatory Visit: Payer: Self-pay | Admitting: Family Medicine

## 2013-09-10 ENCOUNTER — Encounter: Payer: Self-pay | Admitting: Family Medicine

## 2013-09-10 ENCOUNTER — Ambulatory Visit (INDEPENDENT_AMBULATORY_CARE_PROVIDER_SITE_OTHER): Payer: Self-pay | Admitting: Family Medicine

## 2013-09-10 VITALS — BP 135/76 | HR 79 | Ht 76.0 in | Wt 200.0 lb

## 2013-09-10 DIAGNOSIS — IMO0002 Reserved for concepts with insufficient information to code with codable children: Secondary | ICD-10-CM

## 2013-09-10 DIAGNOSIS — S76311A Strain of muscle, fascia and tendon of the posterior muscle group at thigh level, right thigh, initial encounter: Secondary | ICD-10-CM

## 2013-09-10 NOTE — Patient Instructions (Signed)
You have a high grade hamstring strain. These take on average 4-8 weeks to recover. Compression sleeve when up and walking around regularly for the next 6 weeks. Ice or heat (whichever feels better) 15 minutes at a time 3-4 times a day. Start hamstring curls and swings 3 sets of 10 once a day without weight. Start physical therapy in 1 week (if you decide to pursue this). Elevate leg above the level of your heart as much as possible.

## 2013-09-11 ENCOUNTER — Encounter: Payer: Self-pay | Admitting: Family Medicine

## 2013-09-11 DIAGNOSIS — S76311A Strain of muscle, fascia and tendon of the posterior muscle group at thigh level, right thigh, initial encounter: Secondary | ICD-10-CM | POA: Insufficient documentation

## 2013-09-11 NOTE — Progress Notes (Signed)
Patient ID: Duane Banks, male   DOB: 05-08-1986, 28 y.o.   MRN: [id number]  PCP: PROVIDER NOT IN SYSTEM  Subjective:   HPI: Patient is a 28 y.o. male here for right leg injury.  Patient reports he was trying out for a [employer] football team 1 1/2 weeks ago. Was accelerating to catch the ball when he felt a sharp pull, pain in middle portion of medial hamstring. Could not continue playing. Started icing, used a wrap. Now very bruised, pain traveling downt o knee. No prior injuries.  Past Medical History  Diagnosis Date  . Heart valve regurgitation     Current Outpatient Prescriptions on File Prior to Visit  Medication Sig Dispense Refill  . aspirin 325 MG tablet Take 325 mg by mouth daily as needed.      Marland Kitchen. ibuprofen (ADVIL,MOTRIN) 200 MG tablet Take 200 mg by mouth every 6 (six) hours as needed.       No current facility-administered medications on file prior to visit.    History reviewed. No pertinent past surgical history.  No Known Allergies  History   Social History  . Marital Status: Single    Spouse Name: N/A    Number of Children: N/A  . Years of Education: N/A   Occupational History  . Not on file.   Social History Main Topics  . Smoking status: Former Games developermoker  . Smokeless tobacco: Never Used  . Alcohol Use: 0.0 oz/week  . Drug Use: No  . Sexual Activity: Not on file   Other Topics Concern  . Not on file   Social History Narrative  . No narrative on file    Family History  Problem Relation Age of Onset  . Sudden death Neg Hx   . Hypertension Neg Hx   . Hyperlipidemia Neg Hx   . Heart attack Neg Hx   . Diabetes Neg Hx     BP 135/76  Pulse 79  Ht 6\' 4"  (1.93 m)  Wt 200 lb (90.719 kg)  BMI 24.35 kg/m2  Review of Systems: See HPI above.    Objective:  Physical Exam:  Gen: NAD  Right leg: Bruising from mid-posterior upper leg to knee.  Mild swelling.  Small palpable defect medial hamstring. Distal hamstring tendons intact. TTP  medial midportion of right hamstring. FROM knee and hip. Pain and 3/5 strength with knee flexion at 90 and 30 degrees. NVI distally.    Assessment & Plan:  1. Right hamstring strain - Grade 2-3 with small palpable defect, a lot of bruising and decreased strength.  May take up to 8 weeks to recover.  Start with Compression sleeve, basic HEP (curls and swings only).  Consider starting physical therapy in 1 week.  F/u in 4-6 weeks.

## 2013-09-11 NOTE — Assessment & Plan Note (Signed)
Grade 2-3 with small palpable defect, a lot of bruising and decreased strength.  May take up to 8 weeks to recover.  Start with Compression sleeve, basic HEP (curls and swings only).  Consider starting physical therapy in 1 week.  F/u in 4-6 weeks.

## 2013-09-29 ENCOUNTER — Encounter (HOSPITAL_BASED_OUTPATIENT_CLINIC_OR_DEPARTMENT_OTHER): Payer: Self-pay | Admitting: Emergency Medicine

## 2013-09-29 ENCOUNTER — Emergency Department (HOSPITAL_BASED_OUTPATIENT_CLINIC_OR_DEPARTMENT_OTHER)
Admission: EM | Admit: 2013-09-29 | Discharge: 2013-09-29 | Disposition: A | Discharge status: Home / Self Care | Payer: Self-pay | Attending: Emergency Medicine | Admitting: Emergency Medicine

## 2013-09-29 DIAGNOSIS — R110 Nausea: Secondary | ICD-10-CM | POA: Insufficient documentation

## 2013-09-29 DIAGNOSIS — R197 Diarrhea, unspecified: Secondary | ICD-10-CM | POA: Insufficient documentation

## 2013-09-29 DIAGNOSIS — Z7982 Long term (current) use of aspirin: Secondary | ICD-10-CM | POA: Insufficient documentation

## 2013-09-29 DIAGNOSIS — R55 Syncope and collapse: Secondary | ICD-10-CM | POA: Insufficient documentation

## 2013-09-29 DIAGNOSIS — R51 Headache: Secondary | ICD-10-CM | POA: Insufficient documentation

## 2013-09-29 DIAGNOSIS — Z8679 Personal history of other diseases of the circulatory system: Secondary | ICD-10-CM | POA: Insufficient documentation

## 2013-09-29 DIAGNOSIS — Z87891 Personal history of nicotine dependence: Secondary | ICD-10-CM | POA: Insufficient documentation

## 2013-09-29 DIAGNOSIS — Z791 Long term (current) use of non-steroidal anti-inflammatories (NSAID): Secondary | ICD-10-CM | POA: Insufficient documentation

## 2013-09-29 LAB — CBC WITH DIFFERENTIAL/PLATELET
BASOS ABS: 0 10*3/uL (ref 0.0–0.1)
BASOS PCT: 0 % (ref 0–1)
Eosinophils Absolute: 0.1 10*3/uL (ref 0.0–0.7)
Eosinophils Relative: 1 % (ref 0–5)
HCT: 39 % (ref 39.0–52.0)
Hemoglobin: 13.4 g/dL (ref 13.0–17.0)
Lymphocytes Relative: 27 % (ref 12–46)
Lymphs Abs: 1.5 10*3/uL (ref 0.7–4.0)
MCH: 26.5 pg (ref 26.0–34.0)
MCHC: 34.4 g/dL (ref 30.0–36.0)
MCV: 77.1 fL — ABNORMAL LOW (ref 78.0–100.0)
Monocytes Absolute: 0.5 10*3/uL (ref 0.1–1.0)
Monocytes Relative: 10 % (ref 3–12)
NEUTROS ABS: 3.5 10*3/uL (ref 1.7–7.7)
NEUTROS PCT: 62 % (ref 43–77)
PLATELETS: 235 10*3/uL (ref 150–400)
RBC: 5.06 MIL/uL (ref 4.22–5.81)
RDW: 13.8 % (ref 11.5–15.5)
WBC: 5.6 10*3/uL (ref 4.0–10.5)

## 2013-09-29 LAB — COMPREHENSIVE METABOLIC PANEL
ALBUMIN: 4.3 g/dL (ref 3.5–5.2)
ALT: 17 U/L (ref 0–53)
AST: 17 U/L (ref 0–37)
Alkaline Phosphatase: 74 U/L (ref 39–117)
BILIRUBIN TOTAL: 0.3 mg/dL (ref 0.3–1.2)
BUN: 15 mg/dL (ref 6–23)
CHLORIDE: 105 meq/L (ref 96–112)
CO2: 27 mEq/L (ref 19–32)
Calcium: 9.9 mg/dL (ref 8.4–10.5)
Creatinine, Ser: 1.1 mg/dL (ref 0.50–1.35)
GFR calc Af Amer: >90 mL/min (ref >90)
GFR calc non Af Amer: >90 mL/min (ref >90)
Glucose, Bld: 92 mg/dL (ref 70–99)
POTASSIUM: 3.9 meq/L (ref 3.7–5.3)
SODIUM: 145 meq/L (ref 137–147)
Total Protein: 7.1 g/dL (ref 6.0–8.3)

## 2013-09-29 LAB — URINALYSIS, ROUTINE W REFLEX MICROSCOPIC
Bilirubin Urine: NEGATIVE
GLUCOSE, UA: NEGATIVE mg/dL
HGB URINE DIPSTICK: NEGATIVE
Ketones, ur: NEGATIVE mg/dL
Leukocytes, UA: NEGATIVE
Nitrite: NEGATIVE
Protein, ur: NEGATIVE mg/dL
Specific Gravity, Urine: 1.021 (ref 1.005–1.030)
Urobilinogen, UA: 1 mg/dL (ref 0.0–1.0)
pH: 6 (ref 5.0–8.0)

## 2013-09-29 MED ORDER — PROMETHAZINE HCL 25 MG PO TABS: 25.0000 mg | ORAL_TABLET | Freq: Four times a day (QID) | ORAL | Status: DC | PRN

## 2013-09-29 MED ORDER — SODIUM CHLORIDE 0.9 % IV BOLUS (SEPSIS)
1000.0000 mL | Freq: Once | INTRAVENOUS | Status: AC
  Administered 2013-09-29: 1000 mL via INTRAVENOUS

## 2013-09-29 NOTE — ED Notes (Signed)
Reports he came to ED to get "checked out".  He has experienced dizziness described as fullness or floating in his head, not feeling well, vision is blurry and [facility].  Denies recent illness, injury, chest pain or abdominal pain.

## 2013-09-29 NOTE — ED Notes (Signed)
Urinal provided.

## 2013-09-29 NOTE — ED Provider Notes (Signed)
CSN: [id number]     Arrival date & time 09/29/13  1506 History  This chart was scribed for Gilda Creasehristopher J. Ulisses Vondrak, by Smiley HousemanFallon Davis, ED Scribe. The patient was seen in room MH08/MH08. Patient's care was started at 3:53 PM.  Chief Complaint  Patient presents with  . Dizziness   The history is provided by the patient. No language interpreter was used.   HPI Comments: Duane Banks is a 28 y.o. male who presents to the Emergency Department complaining of an intermittent moderate HA with associated dizziness for about 2 weeks.  Pt states his HA is a generalized HA.  Pt reports, "I have just been feeling off lately."  Pt states his hands bilaterally have been extremely cold for the past two weeks.  Pt reports he had 1 episode of diarrhea that was greenish in color.  Pt denies emesis, but states he has associated nausea.  Pt denies sore throat, otalgia, and nasal congestion.    Past Medical History  Diagnosis Date  . Heart valve regurgitation    History reviewed. No pertinent past surgical history. Family History  Problem Relation Age of Onset  . Sudden death Neg Hx   . Hypertension Neg Hx   . Hyperlipidemia Neg Hx   . Heart attack Neg Hx   . Diabetes Neg Hx    History  Substance Use Topics  . Smoking status: Former Games developermoker  . Smokeless tobacco: Never Used  . Alcohol Use: 0.0 oz/week    Review of Systems  Constitutional: Negative for fever and chills.  HENT: Negative for congestion, ear pain, sinus pressure and sore throat.   Respiratory: Negative for cough and shortness of breath.   Cardiovascular: Negative for chest pain.  Gastrointestinal: Positive for nausea and diarrhea. Negative for vomiting and abdominal pain.  Musculoskeletal: Negative for back pain.  Skin: Negative for color change and rash.  Neurological: Positive for dizziness and headaches. Negative for weakness.  Psychiatric/Behavioral: Negative for behavioral problems and confusion.  All other systems reviewed and  are negative.   Allergies  Review of patient's allergies indicates no known allergies.  Home Medications   Prior to Admission medications   Medication Sig Start Date End Date Taking? Authorizing Provider  aspirin 325 MG tablet Take 325 mg by mouth daily as needed.    Historical Provider, MD  ibuprofen (ADVIL,MOTRIN) 200 MG tablet Take 200 mg by mouth every 6 (six) hours as needed.    Historical Provider, MD   Triage Vitals: BP 141/72  Pulse 98  Temp(Src) 98.7 F (37.1 C) (Oral)  Resp 16  Ht 6\' 3"  (1.905 m)  Wt 200 lb (90.719 kg)  BMI 25.00 kg/m2  SpO2 100%  Physical Exam  Constitutional: He is oriented to person, place, and time. He appears well-developed and well-nourished. No distress.  HENT:  Head: Normocephalic and atraumatic.  Right Ear: Hearing normal.  Left Ear: Hearing normal.  Nose: Nose normal.  Mouth/Throat: Oropharynx is clear and moist and mucous membranes are normal.  Eyes: Conjunctivae and EOM are normal. Pupils are equal, round, and reactive to light.  Neck: Normal range of motion. Neck supple.  Cardiovascular: Regular rhythm, S1 normal and S2 normal.  Exam reveals no gallop and no friction rub.   No murmur heard. Pulmonary/Chest: Effort normal and breath sounds normal. No respiratory distress. He exhibits no tenderness.  Abdominal: Soft. Normal appearance and bowel sounds are normal. There is no hepatosplenomegaly. There is no tenderness. There is no rebound, no guarding, no tenderness  at McBurney's point and negative Murphy's sign. No hernia.  Musculoskeletal: Normal range of motion.  Neurological: He is alert and oriented to person, place, and time. He has normal strength. No cranial nerve deficit or sensory deficit. Coordination normal. GCS eye subscore is 4. GCS verbal subscore is 5. GCS motor subscore is 6.  Skin: Skin is warm, dry and intact. No rash noted. No cyanosis.  Psychiatric: He has a normal mood and affect. His speech is normal and behavior is  normal. Thought content normal.    ED Course  Procedures (including critical care time) DIAGNOSTIC STUDIES: Oxygen Saturation is 100% on RA, normal by my interpretation.    COORDINATION OF CARE: 4:07 PM-Will order CBC, comprehensive metabolic panel, and UA.  Patient informed of current plan of treatment and evaluation and agrees with plan.    Results for orders placed during the hospital encounter of 09/29/13  CBC WITH DIFFERENTIAL      Result Value Ref Range   WBC 5.6  4.0 - 10.5 K/uL   RBC 5.06  4.22 - 5.81 MIL/uL   Hemoglobin 13.4  13.0 - 17.0 g/dL   HCT 16.1  09.6 - 04.5 %   MCV 77.1 (*) 78.0 - 100.0 fL   MCH 26.5  26.0 - 34.0 pg   MCHC 34.4  30.0 - 36.0 g/dL   RDW 40.9  81.1 - 91.4 %   Platelets 235  150 - 400 K/uL   Neutrophils Relative % 62  43 - 77 %   Neutro Abs 3.5  1.7 - 7.7 K/uL   Lymphocytes Relative 27  12 - 46 %   Lymphs Abs 1.5  0.7 - 4.0 K/uL   Monocytes Relative 10  3 - 12 %   Monocytes Absolute 0.5  0.1 - 1.0 K/uL   Eosinophils Relative 1  0 - 5 %   Eosinophils Absolute 0.1  0.0 - 0.7 K/uL   Basophils Relative 0  0 - 1 %   Basophils Absolute 0.0  0.0 - 0.1 K/uL  COMPREHENSIVE METABOLIC PANEL      Result Value Ref Range   Sodium 145  137 - 147 mEq/L   Potassium 3.9  3.7 - 5.3 mEq/L   Chloride 105  96 - 112 mEq/L   CO2 27  19 - 32 mEq/L   Glucose, Bld 92  70 - 99 mg/dL   BUN 15  6 - 23 mg/dL   Creatinine, Ser 7.82  0.50 - 1.35 mg/dL   Calcium 9.9  8.4 - 95.6 mg/dL   Total Protein 7.1  6.0 - 8.3 g/dL   Albumin 4.3  3.5 - 5.2 g/dL   AST 17  0 - 37 U/L   ALT 17  0 - 53 U/L   Alkaline Phosphatase 74  39 - 117 U/L   Total Bilirubin 0.3  0.3 - 1.2 mg/dL   GFR calc non Af Amer >90  >90 mL/min   GFR calc Af Amer >90  >90 mL/min  URINALYSIS, ROUTINE W REFLEX MICROSCOPIC      Result Value Ref Range   Color, Urine YELLOW  YELLOW   APPearance CLEAR  CLEAR   Specific Gravity, Urine 1.021  1.005 - 1.030   pH 6.0  5.0 - 8.0   Glucose, UA NEGATIVE  NEGATIVE  mg/dL   Hgb urine dipstick NEGATIVE  NEGATIVE   Bilirubin Urine NEGATIVE  NEGATIVE   Ketones, ur NEGATIVE  NEGATIVE mg/dL   Protein, ur NEGATIVE  NEGATIVE mg/dL  Urobilinogen, UA 1.0  0.0 - 1.0 mg/dL   Nitrite NEGATIVE  NEGATIVE   Leukocytes, UA NEGATIVE  NEGATIVE    Imaging Review No results found.   EKG Interpretation None      MDM   Final diagnoses:  Nausea  Diarrhea  Near syncope   Patient presents to the ER with multiple complaints. It's not clear if the complaints are completely connected, and they have been present for various amounts of time. The patient reports chronic recurrent mild to moderate headaches. This has not significantly changed from his baseline, is experiencing nearly daily headaches. He does not take any medicine for this, they generally resolve on their own. Nothing unusual about the headaches, no change in intensity. No thunderclap, no concerning features for subarachnoid hemorrhage. He has normal neurologic exam.  The patient was feeling like he was going to pass out today. He reports nausea, dizziness and diarrhea. He has only had one episode of diarrhea, and no significant signs of dehydration. Blood work is normal. Patient given IV fluids here in the ER. Patient's abdominal exam is benign, no abdominal tenderness.  As he has an unremarkable exam here in the ER, I do not feel any further workup is necessary. There is nothing unusual about this headache that would warrant neuroimaging. Patient reassured, symptomatic treatment for likely GI viral syndrome including nausea and diarrhea. Encouraged to increase fluid intake. Given work note. Return if symptoms worsen.  I personally performed the services described in this documentation, which was scribed in my presence. The recorded information has been reviewed and is accurate.       Gilda Creasehristopher J. Latoya Maulding, MD 09/29/13 [phone]

## 2013-09-29 NOTE — ED Notes (Signed)
Dizziness on and off for 2 weeks. Headache today.

## 2013-09-29 NOTE — Discharge Instructions (Signed)
Diarrhea Diarrhea is frequent loose and watery bowel movements. It can cause you to feel weak and dehydrated. Dehydration can cause you to become tired and thirsty, have a dry mouth, and have decreased urination that often is dark yellow. Diarrhea is a sign of another problem, most often an infection that will not last long. In most cases, diarrhea typically lasts 2 3 days. However, it can last longer if it is a sign of something more serious. It is important to treat your diarrhea as directed by your caregive to lessen or prevent future episodes of diarrhea. CAUSES  Some common causes include:  Gastrointestinal infections caused by viruses, bacteria, or parasites.  Food poisoning or food allergies.  Certain medicines, such as antibiotics, chemotherapy, and laxatives.  Artificial sweeteners and fructose.  Digestive disorders. HOME CARE INSTRUCTIONS  Ensure adequate fluid intake (hydration): have 1 cup (8 oz) of fluid for each diarrhea episode. Avoid fluids that contain simple sugars or sports drinks, fruit juices, whole milk products, and sodas. Your urine should be clear or pale yellow if you are drinking enough fluids. Hydrate with an oral rehydration solution that you can purchase at pharmacies, retail stores, and online. You can prepare an oral rehydration solution at home by mixing the following ingredients together:    tsp table salt.   tsp baking soda.   tsp salt substitute containing potassium chloride.  1  tablespoons sugar.  1 L (34 oz) of water.  Certain foods and beverages may increase the speed at which food moves through the gastrointestinal (GI) tract. These foods and beverages should be avoided and include:  Caffeinated and alcoholic beverages.  High-fiber foods, such as raw fruits and vegetables, nuts, seeds, and whole grain breads and cereals.  Foods and beverages sweetened with sugar alcohols, such as xylitol, sorbitol, and mannitol.  Some foods may be well  tolerated and may help thicken stool including:  Starchy foods, such as rice, toast, pasta, low-sugar cereal, oatmeal, grits, baked potatoes, crackers, and bagels.  Bananas.  Applesauce.  Add probiotic-rich foods to help increase healthy bacteria in the GI tract, such as yogurt and fermented milk products.  Wash your hands well after each diarrhea episode.  Only take over-the-counter or prescription medicines as directed by your caregiver.  Take a warm bath to relieve any burning or pain from frequent diarrhea episodes. SEEK IMMEDIATE MEDICAL CARE IF:   You are unable to keep fluids down.  You have persistent vomiting.  You have blood in your stool, or your stools are black and tarry.  You do not urinate in 6 8 hours, or there is only a small amount of very dark urine.  You have abdominal pain that increases or localizes.  You have weakness, dizziness, confusion, or lightheadedness.  You have a severe headache.  Your diarrhea gets worse or does not get better.  You have a fever or persistent symptoms for more than 2 3 days.  You have a fever and your symptoms suddenly get worse. MAKE SURE YOU:   Understand these instructions.  Will watch your condition.  Will get help right away if you are not doing well or get worse. Document Released: 05/12/2002 Document Revised: 05/08/2012 Document Reviewed: 01/28/2012 [facility] Patient Information 2014 [place]ExitCare, [place]LL[place].  Nausea, Adult Nausea is the feeling that you have an upset stomach or have to vomit. Nausea by itself is not likely a serious concern, but it may be an early sign of more serious medical problems. As nausea gets worse, it  can lead to vomiting. If vomiting develops, there is the risk of dehydration.  CAUSES   Viral infections.  Food poisoning.  Medicines.  Pregnancy.  Motion sickness.  Migraine headaches.  Emotional distress.  Severe pain from any source.  Alcohol intoxication. HOME CARE  INSTRUCTIONS  Get plenty of rest.  Ask your caregiver about specific rehydration instructions.  Eat small amounts of food and sip liquids more often.  Take all medicines as told by your caregiver. SEEK MEDICAL CARE IF:  You have not improved after 2 days, or you get worse.  You have a headache. SEEK IMMEDIATE MEDICAL CARE IF:   You have a fever.  You faint.  You keep vomiting or have blood in your vomit.  You are extremely weak or dehydrated.  You have dark or bloody stools.  You have severe chest or abdominal pain. MAKE SURE YOU:  Understand these instructions.  Will watch your condition.  Will get help right away if you are not doing well or get worse. Document Released: 06/29/2004 Document Revised: 02/14/2012 Document Reviewed: 02/01/2011 [facility] Patient Information 2014 [place]ExitCare, [place]LLC.  Near-Syncope Near-syncope (commonly known as near fainting) is sudden weakness, dizziness, or feeling like you might pass out. During an episode of near-syncope, you may also develop pale skin, have tunnel vision, or feel sick to your stomach (nauseous). Near-syncope may occur when getting up after sitting or while standing for a long time. It is caused by a sudden decrease in blood flow to the brain. This decrease can result from various causes or triggers, most of which are not serious. However, because near-syncope can sometimes be a sign of something serious, a medical evaluation is required. The specific cause is often not determined. HOME CARE INSTRUCTIONS  Monitor your condition for any changes. The following actions may help to alleviate any discomfort you are experiencing:  Have someone stay with you until you feel stable.  Lie down right away if you start feeling like you might faint. Breathe deeply and steadily. Wait until all the symptoms have passed. Most of these episodes last only a few minutes. You may feel tired for several hours.   Drink enough fluids to keep your  urine clear or pale yellow.   If you are taking blood pressure or heart medicine, get up slowly when seated or lying down. Take several minutes to sit and then stand. This can reduce dizziness.  Follow up with your health care provider as directed. SEEK IMMEDIATE MEDICAL CARE IF:   You have a severe headache.   You have unusual pain in the chest, abdomen, or back.   You are bleeding from the mouth or rectum, or you have black or tarry stool.   You have an irregular or very fast heartbeat.   You have repeated fainting or have seizure-like jerking during an episode.   You faint when sitting or lying down.   You have confusion.   You have difficulty walking.   You have severe weakness.   You have vision problems.  MAKE SURE YOU:   Understand these instructions.  Will watch your condition.  Will get help right away if you are not doing well or get worse. Document Released: 05/22/2005 Document Revised: 01/22/2013 Document Reviewed: 10/25/2012 [facility] Patient Information 2014 [place]ExitCare, [place]LL[place].

## 2013-12-01 ENCOUNTER — Emergency Department (HOSPITAL_BASED_OUTPATIENT_CLINIC_OR_DEPARTMENT_OTHER)
Admission: EM | Admit: 2013-12-01 | Discharge: 2013-12-01 | Disposition: A | Discharge status: Home / Self Care | Payer: Self-pay | Attending: Emergency Medicine | Admitting: Emergency Medicine

## 2013-12-01 ENCOUNTER — Encounter (HOSPITAL_BASED_OUTPATIENT_CLINIC_OR_DEPARTMENT_OTHER): Payer: Self-pay | Admitting: Emergency Medicine

## 2013-12-01 ENCOUNTER — Emergency Department (HOSPITAL_BASED_OUTPATIENT_CLINIC_OR_DEPARTMENT_OTHER): Payer: Self-pay

## 2013-12-01 DIAGNOSIS — R079 Chest pain, unspecified: Secondary | ICD-10-CM | POA: Insufficient documentation

## 2013-12-01 DIAGNOSIS — Z87891 Personal history of nicotine dependence: Secondary | ICD-10-CM | POA: Insufficient documentation

## 2013-12-01 DIAGNOSIS — Z8679 Personal history of other diseases of the circulatory system: Secondary | ICD-10-CM | POA: Insufficient documentation

## 2013-12-01 DIAGNOSIS — R0602 Shortness of breath: Secondary | ICD-10-CM | POA: Insufficient documentation

## 2013-12-01 DIAGNOSIS — Z79899 Other long term (current) drug therapy: Secondary | ICD-10-CM | POA: Insufficient documentation

## 2013-12-01 DIAGNOSIS — Z7982 Long term (current) use of aspirin: Secondary | ICD-10-CM | POA: Insufficient documentation

## 2013-12-01 DIAGNOSIS — R0789 Other chest pain: Secondary | ICD-10-CM

## 2013-12-01 NOTE — Discharge Instructions (Signed)

## 2013-12-01 NOTE — ED Provider Notes (Signed)
Medical screening examination/treatment/procedure(s) were performed by non-physician practitioner and as supervising physician I was immediately available for consultation/collaboration.   EKG Interpretation   Date/Time:  Monday December 01 2013 16:58:40 EDT Ventricular Rate:  62 PR Interval:  172 QRS Duration: 86 QT Interval:  354 QTC Calculation: 359 R Axis:   76 Text Interpretation:  Normal sinus rhythm Early repolarization Normal ECG  No significant change since last tracing Confirmed by KNAPP  MD-J, JON  ([id number]) on 12/01/2013 5:28:37 PM      Linwood DibblesJon Knapp, MD 12/01/13 2329

## 2013-12-01 NOTE — ED Provider Notes (Signed)
CSN: [id number]     Arrival date & time 12/01/13  1555 History   First MD Initiated Contact with Patient 12/01/13 1618     Chief Complaint  Patient presents with  . Shortness of Breath     (Consider location/radiation/quality/duration/timing/severity/associated sxs/prior Treatment) Patient is a 28 y.o. male presenting with chest pain. The history is provided by the patient. No language interpreter was used.  Chest Pain Pain location:  L chest Pain quality: aching   Pain radiates to:  Does not radiate Pain radiates to the back: no   Pain severity:  Unable to specify Onset quality:  Sudden Duration:  6 months Timing:  Sporadic Chronicity:  Recurrent Context: not breathing   Worsened by:  Nothing tried Ineffective treatments:  None tried Associated symptoms: no abdominal pain    Pt has pain on left side of chest.  Pt reports comes and goes,  Last seconds.   Pt reports he felt short of breath.  Now resolved.   Pt reports he has been told that he has a valve disease.   Pt reports he came in to get checked and for referral to cardiologist to check his valve Past Medical History  Diagnosis Date  . Heart valve regurgitation    History reviewed. No pertinent past surgical history. Family History  Problem Relation Age of Onset  . Sudden death Neg Hx   . Hypertension Neg Hx   . Hyperlipidemia Neg Hx   . Heart attack Neg Hx   . Diabetes Neg Hx    History  Substance Use Topics  . Smoking status: Former Games developermoker  . Smokeless tobacco: Never Used  . Alcohol Use: 0.0 oz/week    Review of Systems  Cardiovascular: Positive for chest pain.  Gastrointestinal: Negative for abdominal pain.  All other systems reviewed and are negative.     Allergies  Review of patient's allergies indicates no known allergies.  Home Medications   Prior to Admission medications   Medication Sig Start Date End Date Taking? Authorizing Provider  aspirin 325 MG tablet Take 325 mg by mouth daily as  needed.    Historical Provider, MD  ibuprofen (ADVIL,MOTRIN) 200 MG tablet Take 200 mg by mouth every 6 (six) hours as needed.    Historical Provider, MD  promethazine (PHENERGAN) 25 MG tablet Take 1 tablet (25 mg total) by mouth every 6 (six) hours as needed for nausea or vomiting. 09/29/13   Gilda Creasehristopher J. Pollina, MD   BP 140/70  Pulse 81  Temp(Src) 98.1 F (36.7 C)  Resp 16  Ht 6\' 3"  (1.905 m)  Wt 205 lb (92.987 kg)  BMI 25.62 kg/m2  SpO2 100% Physical Exam  Nursing note and vitals reviewed. Constitutional: He is oriented to person, place, and time. He appears well-developed and well-nourished.  HENT:  Head: Normocephalic.  Eyes: EOM are normal. Pupils are equal, round, and reactive to light.  Neck: Normal range of motion.  Cardiovascular: Normal rate.   Pulmonary/Chest: Effort normal.  Abdominal: Soft. He exhibits no distension.  Musculoskeletal: Normal range of motion.  Neurological: He is alert and oriented to person, place, and time.  Skin: Skin is warm.  Psychiatric: He has a normal mood and affect.    ED Course  Procedures (including critical care time) Labs Review Labs Reviewed - No data to display  Imaging Review Dg Chest 2 View  12/01/2013   CLINICAL DATA:  Shortness of breath.  EXAM: CHEST  2 VIEW  COMPARISON:  07/14/2013  FINDINGS:  The heart size and mediastinal contours are within normal limits. Both lungs are clear. The visualized skeletal structures are unremarkable.  IMPRESSION: No active cardiopulmonary disease.   Electronically Signed   By: Herbie BaltimoreWalt  Liebkemann M.D.   On: 12/01/2013 16:52     EKG Interpretation   Date/Time:  Monday December 01 2013 16:58:40 EDT Ventricular Rate:  62 PR Interval:  172 QRS Duration: 86 QT Interval:  354 QTC Calculation: 359 R Axis:   76 Text Interpretation:  Normal sinus rhythm Early repolarization Normal ECG  No significant change since last tracing Confirmed by KNAPP  MD-J, JON  [id number]) on 12/01/2013 5:28:37 PM       MDM   Final diagnoses:  Chest pain of uncertain etiology    Follow up with cardiology for evaluation    Elson AreasLeslie K Markavious Micco, PA-C 12/01/13 1940

## 2013-12-01 NOTE — ED Notes (Signed)
Pt c/o SOB and dizziness x 4 hrs

## 2013-12-25 ENCOUNTER — Encounter: Payer: Self-pay | Admitting: *Deleted

## 2014-01-07 ENCOUNTER — Ambulatory Visit (INDEPENDENT_AMBULATORY_CARE_PROVIDER_SITE_OTHER): Payer: Self-pay | Admitting: Cardiology

## 2014-01-07 ENCOUNTER — Encounter: Payer: Self-pay | Admitting: Cardiology

## 2014-01-07 VITALS — BP 120/80 | HR 61 | Ht 75.0 in | Wt 204.0 lb

## 2014-01-07 DIAGNOSIS — R072 Precordial pain: Secondary | ICD-10-CM

## 2014-01-07 DIAGNOSIS — R002 Palpitations: Secondary | ICD-10-CM | POA: Insufficient documentation

## 2014-01-07 DIAGNOSIS — R0602 Shortness of breath: Secondary | ICD-10-CM | POA: Insufficient documentation

## 2014-01-07 DIAGNOSIS — R079 Chest pain, unspecified: Secondary | ICD-10-CM

## 2014-01-07 LAB — TSH: TSH: 1.765 u[IU]/mL (ref 0.350–4.500)

## 2014-01-07 NOTE — Patient Instructions (Signed)
Have your labs done today   We are ordering a stress test  Your physician recommends that you schedule a follow-up appointment in: as needed

## 2014-01-07 NOTE — Progress Notes (Signed)
HPI The patient presents for evaluation of multiple cardiovascular complaints. He says his he has had an echocardiogram a few years ago and was told that he had trivial valvular disease.  He reports that he's been getting chest pain for years. Been noticing this sporadically. It happened when he exercises but not always. He can't quantify or qualify this. It does not seem to be associated symptoms such as not see a vomiting or diaphoresis. It seems to be left sided. It may also be palpitations that seem to happen more after he exercises. He does have some at rest sometimes as a strong beat and sometimes as a fast beat. He describes also occasionally some irregular beats. He also does have he thinks shortness of breath sometimes with activities but is not describing PND or orthopnea.  No Known Allergies  Current Outpatient Prescriptions  Medication Sig Dispense Refill  . aspirin 325 MG tablet Take 325 mg by mouth daily as needed.      Marland Kitchen ibuprofen (ADVIL,MOTRIN) 200 MG tablet Take 200 mg by mouth every 6 (six) hours as needed.      . promethazine (PHENERGAN) 25 MG tablet Take 1 tablet (25 mg total) by mouth every 6 (six) hours as needed for nausea or vomiting.  30 tablet  0   No current facility-administered medications for this visit.    Past Medical History  Diagnosis Date  . Heart valve regurgitation     Past Surgical History  Procedure Laterality Date  . None      Family History  Problem Relation Age of Onset  . Sudden death Neg Hx   . Hypertension Neg Hx   . Hyperlipidemia Neg Hx   . Heart attack Neg Hx   . Diabetes Neg Hx     History   Social History  . Marital Status: Single    Spouse Name: N/A    Number of Children: N/A  . Years of Education: N/A   Occupational History  . Not on file.   Social History Main Topics  . Smoking status: Former Games developer  . Smokeless tobacco: Never Used     Comment: Quit 2009  . Alcohol Use: 0.0 oz/week  . Drug Use: No  . Sexual  Activity: Not on file   Other Topics Concern  . Not on file   Social History Narrative  . No narrative on file    ROS:  Positive for pressure in his head, dizziness, reflux, occasional diarrhea, occasional nausea.  Otherwise as stated in the HPI and negative for all other systems.  PHYSICAL EXAM BP 120/80  Pulse 61  Ht 6\' 3"  (1.905 m)  Wt 204 lb (92.534 kg)  BMI 25.50 kg/m2 GENERAL:  Well appearing HEENT:  Pupils equal round and reactive, fundi not visualized, oral mucosa unremarkable NECK:  No jugular venous distention, waveform within normal limits, carotid upstroke brisk and symmetric, no bruits, no thyromegaly LYMPHATICS:  No cervical, inguinal adenopathy LUNGS:  Clear to auscultation bilaterally BACK:  No CVA tenderness CHEST:  Unremarkable HEART:  PMI not displaced or sustained,S1 and S2 within normal limits, no S3, no S4, no clicks, no rubs, no murmurs ABD:  Flat, positive bowel sounds normal in frequency in pitch, no bruits, no rebound, no guarding, no midline pulsatile mass, no hepatomegaly, no splenomegaly EXT:  2 plus pulses throughout, no edema, no cyanosis no clubbing SKIN:  No rashes no nodules NEURO:  Cranial nerves II through XII grossly intact, motor grossly intact throughout PSYCH:  Cognitively intact, oriented to person place and time   EKG:  Normal sinus rhythm, rate 61, axis within normal limits, intervals within normal limits, early repolarization pattern.  01/07/2014  ASSESSMENT AND PLAN  CHEST PAIN:  His chest discomfort is quite atypical. I think the pretest probability of obstructive coronary disease is low. I will bring the patient back for a POET (Plain Old Exercise Test). This will allow me to screen for obstructive coronary disease, risk stratify and very importantly provide a prescription for exercise.  He didn't angina possibility of a hypertensive issue in the past and so we can evaluate this at the time the treadmill as well.  DYSPNEA:    I will  check a BNP level. If this is normal no further cardiac workup will be suggested.

## 2014-01-08 ENCOUNTER — Emergency Department (HOSPITAL_BASED_OUTPATIENT_CLINIC_OR_DEPARTMENT_OTHER)
Admission: EM | Admit: 2014-01-08 | Discharge: 2014-01-08 | Disposition: A | Discharge status: Home / Self Care | Payer: Self-pay | Attending: Emergency Medicine | Admitting: Emergency Medicine

## 2014-01-08 ENCOUNTER — Encounter (HOSPITAL_BASED_OUTPATIENT_CLINIC_OR_DEPARTMENT_OTHER): Payer: Self-pay | Admitting: Emergency Medicine

## 2014-01-08 DIAGNOSIS — R42 Dizziness and giddiness: Secondary | ICD-10-CM | POA: Insufficient documentation

## 2014-01-08 DIAGNOSIS — Z791 Long term (current) use of non-steroidal anti-inflammatories (NSAID): Secondary | ICD-10-CM | POA: Insufficient documentation

## 2014-01-08 DIAGNOSIS — Z7982 Long term (current) use of aspirin: Secondary | ICD-10-CM | POA: Insufficient documentation

## 2014-01-08 DIAGNOSIS — Z8679 Personal history of other diseases of the circulatory system: Secondary | ICD-10-CM | POA: Insufficient documentation

## 2014-01-08 DIAGNOSIS — R519 Headache, unspecified: Secondary | ICD-10-CM

## 2014-01-08 DIAGNOSIS — H539 Unspecified visual disturbance: Secondary | ICD-10-CM | POA: Insufficient documentation

## 2014-01-08 DIAGNOSIS — Z87891 Personal history of nicotine dependence: Secondary | ICD-10-CM | POA: Insufficient documentation

## 2014-01-08 DIAGNOSIS — R51 Headache: Secondary | ICD-10-CM | POA: Insufficient documentation

## 2014-01-08 LAB — CBC
HCT: 44 % (ref 39.0–52.0)
Hemoglobin: 15.3 g/dL (ref 13.0–17.0)
MCH: 26.3 pg (ref 26.0–34.0)
MCHC: 34.8 g/dL (ref 30.0–36.0)
MCV: 75.6 fL — ABNORMAL LOW (ref 78.0–100.0)
Platelets: 281 10*3/uL (ref 150–400)
RBC: 5.82 MIL/uL — AB (ref 4.22–5.81)
RDW: 13.5 % (ref 11.5–15.5)
WBC: 7.2 10*3/uL (ref 4.0–10.5)

## 2014-01-08 LAB — URINALYSIS, ROUTINE W REFLEX MICROSCOPIC
Bilirubin Urine: NEGATIVE
GLUCOSE, UA: NEGATIVE mg/dL
Hgb urine dipstick: NEGATIVE
Ketones, ur: NEGATIVE mg/dL
Leukocytes, UA: NEGATIVE
Nitrite: NEGATIVE
PH: 7 (ref 5.0–8.0)
Protein, ur: NEGATIVE mg/dL
Specific Gravity, Urine: 1.016 (ref 1.005–1.030)
Urobilinogen, UA: 0.2 mg/dL (ref 0.0–1.0)

## 2014-01-08 LAB — BASIC METABOLIC PANEL
Anion gap: 12 (ref 5–15)
BUN: 11 mg/dL (ref 6–23)
CO2: 29 mEq/L (ref 19–32)
Calcium: 10.4 mg/dL (ref 8.4–10.5)
Chloride: 100 mEq/L (ref 96–112)
Creatinine, Ser: 1.1 mg/dL (ref 0.50–1.35)
GFR calc Af Amer: >90 mL/min (ref >90)
GFR calc non Af Amer: >90 mL/min (ref >90)
Glucose, Bld: 96 mg/dL (ref 70–99)
POTASSIUM: 4 meq/L (ref 3.7–5.3)
SODIUM: 141 meq/L (ref 137–147)

## 2014-01-08 LAB — BRAIN NATRIURETIC PEPTIDE: BRAIN NATRIURETIC PEPTIDE: 8.7 pg/mL (ref 0.0–100.0)

## 2014-01-08 MED ORDER — KETOROLAC TROMETHAMINE 30 MG/ML IJ SOLN
30.0000 mg | Freq: Once | INTRAMUSCULAR | Status: AC
  Administered 2014-01-08: 30 mg via INTRAVENOUS
  Filled 2014-01-08: qty 1

## 2014-01-08 MED ORDER — SODIUM CHLORIDE 0.9 % IV BOLUS (SEPSIS)
2000.0000 mL | Freq: Once | INTRAVENOUS | Status: AC
  Administered 2014-01-08: 2000 mL via INTRAVENOUS

## 2014-01-08 NOTE — Discharge Instructions (Signed)
1. Medications: usual home medications 2. Treatment: rest, drink plenty of fluids,  3. Follow Up: Please followup with your primary doctor, neurology and opthamology for discussion of your diagnoses and further evaluation after today's visit; i   General Headache Without Cause A headache is pain or discomfort felt around the head or neck area. The specific cause of a headache may not be found. There are many causes and types of headaches. A few common ones are:  Tension headaches.  Migraine headaches.  Cluster headaches.  Chronic daily headaches. HOME CARE INSTRUCTIONS   Keep all follow-up appointments with your caregiver or any specialist referral.  Only take over-the-counter or prescription medicines for pain or discomfort as directed by your caregiver.  Lie down in a dark, quiet room when you have a headache.  Keep a headache journal to find out what may trigger your migraine headaches. For example, write down:  What you eat and drink.  How much sleep you get.  Any change to your diet or medicines.  Try massage or other relaxation techniques.  Put ice packs or heat on the head and neck. Use these 3 to 4 times per day for 15 to 20 minutes each time, or as needed.  Limit stress.  Sit up straight, and do not tense your muscles.  Quit smoking if you smoke.  Limit alcohol use.  Decrease the amount of caffeine you drink, or stop drinking caffeine.  Eat and sleep on a regular schedule.  Get 7 to 9 hours of sleep, or as recommended by your caregiver.  Keep lights dim if bright lights bother you and make your headaches worse. SEEK MEDICAL CARE IF:   You have problems with the medicines you were prescribed.  Your medicines are not working.  You have a change from the usual headache.  You have nausea or vomiting. SEEK IMMEDIATE MEDICAL CARE IF:   Your headache becomes severe.  You have a fever.  You have a stiff neck.  You have loss of vision.  You have  muscular weakness or loss of muscle control.  You start losing your balance or have trouble walking.  You feel faint or pass out.  You have severe symptoms that are different from your first symptoms. MAKE SURE YOU:   Understand these instructions.  Will watch your condition.  Will get help right away if you are not doing well or get worse. Document Released: 05/22/2005 Document Revised: 08/14/2011 Document Reviewed: 06/07/2011 [facility] Patient Information 2015 [place]ExitCare, [place]LLC. This information is not intended to replace advice given to you by your health care provider. Make sure you discuss any questions you have with your health care provider.

## 2014-01-08 NOTE — ED Notes (Signed)
Pt c/o " head pressure" and h/a with dizziness x 6 hrs

## 2014-01-08 NOTE — ED Provider Notes (Signed)
CSN: [id number]     Arrival date & time 01/08/14  1721 History   First MD Initiated Contact with Patient 01/08/14 1739     Chief Complaint  Patient presents with  . Headache     (Consider location/radiation/quality/duration/timing/severity/associated sxs/prior Treatment) The history is provided by the patient and medical records. No language interpreter was used.    Duane Banks is a 28 y.o. male  with a hx of cardiac valve regurg presents to the Emergency Department complaining of gradual, persistent, progressively worsening headache along the front but worst on the right side onset this morning. Pt reports that he frequently gets headaches, but they are always different each time.  Associated symptoms include lightheadedness and feeling unsteady, but denies feelings of the room spinning.  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  Pt was recently seen by cardiology for CP and SOB, but denies these symptoms today. Pt took 1 ASA today without relief. Nothing makes it better and nothing makes it worse.  Pt denies fever, chills, neck pain, neck stiffness, weakness, syncope, dysuria, hematuria.  Pt endorsing "blurriness" in his vision in the dark, but this has been present for the last 2 years and he has not been evaluated by opthalmology.     Past Medical History  Diagnosis Date  . Heart valve regurgitation    Past Surgical History  Procedure Laterality Date  . None     Family History  Problem Relation Age of Onset  . Sudden death Neg Hx   . Hypertension Neg Hx   . Hyperlipidemia Neg Hx   . Heart attack Neg Hx   . Diabetes Neg Hx    History  Substance Use Topics  . Smoking status: Former Games developermoker  . Smokeless tobacco: Never Used     Comment: Quit 2009  . Alcohol Use: 0.0 oz/week    Review of Systems  Constitutional: Negative for fever, diaphoresis, appetite change, fatigue and unexpected weight change.  HENT: Negative  for mouth sores.   Eyes: Positive for visual disturbance (> 2 years).  Respiratory: Negative for cough, chest tightness, shortness of breath and wheezing.   Cardiovascular: Negative for chest pain.  Gastrointestinal: Negative for nausea, vomiting, abdominal pain, diarrhea and constipation.  Endocrine: Negative for polydipsia, polyphagia and polyuria.  Genitourinary: Negative for dysuria, urgency, frequency and hematuria.  Musculoskeletal: Negative for back pain and neck stiffness.  Skin: Negative for rash.  Allergic/Immunologic: Negative for immunocompromised state.  Neurological: Positive for light-headedness and headaches. Negative for syncope.  Hematological: Does not bruise/bleed easily.  Psychiatric/Behavioral: Negative for sleep disturbance. The patient is not nervous/anxious.       Allergies  Review of patient's allergies indicates no known allergies.  Home Medications   Prior to Admission medications   Medication Sig Start Date End Date Taking? Authorizing Provider  aspirin 325 MG tablet Take 325 mg by mouth daily as needed.    Historical Provider, MD  ibuprofen (ADVIL,MOTRIN) 200 MG tablet Take 200 mg by mouth every 6 (six) hours as needed.    Historical Provider, MD  promethazine (PHENERGAN) 25 MG tablet Take 1 tablet (25 mg total) by mouth every 6 (six) hours as needed for nausea or vomiting. 09/29/13   Gilda Creasehristopher J. Pollina, MD   BP 135/68  Pulse 65  Temp(Src) 98.4 F (36.9 C)  Resp 16  Ht 6\' 3"  (1.905 m)  Wt 204 lb (92.534 kg)  BMI 25.50  kg/m2  SpO2 100% Physical Exam  Nursing note and vitals reviewed. Constitutional: He is oriented to person, place, and time. He appears well-developed and well-nourished. No distress.  HENT:  Head: Normocephalic and atraumatic.  Right Ear: Tympanic membrane, external ear and ear canal normal.  Left Ear: Tympanic membrane, external ear and ear canal normal.  Nose: Nose normal. No epistaxis. Right sinus exhibits no maxillary  sinus tenderness and no frontal sinus tenderness. Left sinus exhibits no maxillary sinus tenderness and no frontal sinus tenderness.  Mouth/Throat: Uvula is midline, oropharynx is clear and moist and mucous membranes are normal. Mucous membranes are not pale and not cyanotic. No oropharyngeal exudate, posterior oropharyngeal edema, posterior oropharyngeal erythema or tonsillar abscesses.  Eyes: Conjunctivae and EOM are normal. Pupils are equal, round, and reactive to light. No scleral icterus.  No horizontal, vertical or rotational nystagmus  Neck: Normal range of motion and full passive range of motion without pain. Neck supple.  Full active and passive ROM without pain No midline or paraspinal tenderness No nuchal rigidity or meningeal signs  Cardiovascular: Normal rate, regular rhythm, normal heart sounds and intact distal pulses.   No murmur heard. Pulmonary/Chest: Effort normal and breath sounds normal. No stridor. No respiratory distress. He has no wheezes. He has no rales.  Clear and equal breath sounds without focal wheezes, rhonchi, rales  Abdominal: Soft. Bowel sounds are normal. He exhibits no distension. There is no tenderness. There is no rebound and no guarding.  Musculoskeletal: Normal range of motion.  Lymphadenopathy:    He has no cervical adenopathy.  Neurological: He is alert and oriented to person, place, and time. He has normal reflexes. No cranial nerve deficit. He exhibits normal muscle tone. Coordination normal.  Mental Status:  Alert, oriented, thought content appropriate. Speech fluent without evidence of aphasia. Able to follow 2 step commands without difficulty.  Cranial Nerves:  II:  Peripheral visual fields grossly normal, pupils equal, round, reactive to light III,IV, VI: ptosis not present, extra-ocular motions intact bilaterally  V,VII: smile symmetric, facial light touch sensation equal VIII: hearing grossly normal bilaterally  IX,X: gag reflex present  XI:  bilateral shoulder shrug equal and strong XII: midline tongue extension  Motor:  5/5 in upper and lower extremities bilaterally including strong and equal grip strength and dorsiflexion/plantar flexion Sensory: Pinprick and light touch normal in all extremities.  Deep Tendon Reflexes: 2+ and symmetric  Cerebellar: normal finger-to-nose with bilateral upper extremities Gait: normal gait and balance CV: distal pulses palpable throughout   Skin: Skin is warm and dry. No rash noted. He is not diaphoretic. No erythema.  Psychiatric: He has a normal mood and affect. His behavior is normal. Judgment and thought content normal.    ED Course  Procedures (including critical care time) Labs Review Labs Reviewed  CBC - Abnormal; Notable for the following:    RBC 5.82 (*)    MCV 75.6 (*)    All other components within normal limits  BASIC METABOLIC PANEL  URINALYSIS, ROUTINE W REFLEX MICROSCOPIC    Imaging Review No results found.   EKG Interpretation None      MDM   Final diagnoses:  Acute nonintractable headache, unspecified headache type  Lightheadedness   Duane Magic presents with headache and lightheadedness after working in a hot environment for many days and only moderate fluid intake.  Pt HA treated and improved while in ED.  Presentation is like previous HAs though there seems to be no pattern and non  concerning for [facility], ICH, Meningitis, or temporal arteritis. Pt is afebrile with no focal neuro deficits, nuchal rigidity, or change in vision. Pt given fluid bolus and Toradol with complete resolution of his lightheadedness and headaches. Pt is to follow up with neurology for further discussion of his headaches and ophthalmology for discussion of his blurry vision x2 years. Pt verbalizes understanding and is agreeable with plan to dc.   BP 135/68  Pulse 65  Temp(Src) 98.4 F (36.9 C)  Resp 16  Ht 6\' 3"  (1.905 m)  Wt 204 lb (92.534 kg)  BMI 25.50 kg/m2  SpO2  100%    Dierdre Forth, PA-C 01/08/14 2154

## 2014-01-08 NOTE — ED Provider Notes (Signed)
Medical screening examination/treatment/procedure(s) were performed by non-physician practitioner and as supervising physician I was immediately available for consultation/collaboration.   EKG Interpretation None        Purvis SheffieldForrest Myrtie Leuthold, MD 01/08/14 2254

## 2014-01-08 NOTE — ED Notes (Signed)
ED PA at bedside

## 2014-01-29 ENCOUNTER — Telehealth (HOSPITAL_COMMUNITY): Payer: Self-pay

## 2014-01-29 NOTE — Telephone Encounter (Signed)
Encounter complete. 

## 2014-02-03 ENCOUNTER — Ambulatory Visit (HOSPITAL_COMMUNITY)
Admission: RE | Admit: 2014-02-03 | Discharge: 2014-02-03 | Disposition: A | Discharge status: Home / Self Care | Payer: Self-pay | Source: Ambulatory Visit | Attending: Cardiology | Admitting: Cardiology

## 2014-02-03 DIAGNOSIS — R0602 Shortness of breath: Secondary | ICD-10-CM | POA: Insufficient documentation

## 2014-02-03 DIAGNOSIS — R002 Palpitations: Secondary | ICD-10-CM | POA: Insufficient documentation

## 2014-02-03 DIAGNOSIS — R079 Chest pain, unspecified: Secondary | ICD-10-CM | POA: Insufficient documentation

## 2014-02-03 DIAGNOSIS — R072 Precordial pain: Secondary | ICD-10-CM | POA: Insufficient documentation

## 2014-02-03 NOTE — Procedures (Signed)
Exercise Treadmill Test  Test  Exercise Tolerance Test Ordering MD: Angelina Sheriff, MD    Unique Test No: 1   Treadmill:  1  Indication for ETT: chest pain - rule out ischemia  Contraindication to ETT: No   Stress Modality: exercise - treadmill  Cardiac Imaging Performed: non   Protocol: standard Bruce - maximal  Max BP:  150/51  Max MPHR (bpm):  194 85% MPR (bpm):  165  MPHR obtained (bpm):  181 % MPHR obtained:  93  Reached 85% MPHR (min:sec):  9:20 Total Exercise Time (min-sec):  11  Workload in METS:  13.4 Borg Scale: 16  Reason ETT Terminated:  Leg Discomfort    ST Segment Analysis At Rest: NSR, early repolarization abnormality. With Exercise: no evidence of significant ST depression  Other Information Arrhythmia:  No Angina during ETT:  absent (0) Quality of ETT:  diagnostic  ETT Interpretation:  normal - no evidence of ischemia by ST analysis  Comments: ETT with good exercise tolerance, no chest pain, normal BP response and no ST changes; negative adequate ETT.  Duane Banks

## 2014-03-11 ENCOUNTER — Encounter (HOSPITAL_COMMUNITY): Payer: Self-pay | Admitting: Emergency Medicine

## 2014-03-11 ENCOUNTER — Emergency Department (HOSPITAL_COMMUNITY): Payer: Self-pay

## 2014-03-11 DIAGNOSIS — Z7982 Long term (current) use of aspirin: Secondary | ICD-10-CM | POA: Insufficient documentation

## 2014-03-11 DIAGNOSIS — R0602 Shortness of breath: Secondary | ICD-10-CM | POA: Insufficient documentation

## 2014-03-11 DIAGNOSIS — Z79899 Other long term (current) drug therapy: Secondary | ICD-10-CM | POA: Insufficient documentation

## 2014-03-11 DIAGNOSIS — Z8679 Personal history of other diseases of the circulatory system: Secondary | ICD-10-CM | POA: Insufficient documentation

## 2014-03-11 DIAGNOSIS — R109 Unspecified abdominal pain: Secondary | ICD-10-CM | POA: Insufficient documentation

## 2014-03-11 LAB — URINALYSIS, ROUTINE W REFLEX MICROSCOPIC
Bilirubin Urine: NEGATIVE
Glucose, UA: NEGATIVE mg/dL
HGB URINE DIPSTICK: NEGATIVE
KETONES UR: NEGATIVE mg/dL
Leukocytes, UA: NEGATIVE
Nitrite: NEGATIVE
PH: 6 (ref 5.0–8.0)
PROTEIN: NEGATIVE mg/dL
Specific Gravity, Urine: 1.013 (ref 1.005–1.030)
Urobilinogen, UA: 0.2 mg/dL (ref 0.0–1.0)

## 2014-03-11 LAB — BASIC METABOLIC PANEL
Anion gap: 13 (ref 5–15)
BUN: 12 mg/dL (ref 6–23)
CALCIUM: 9.1 mg/dL (ref 8.4–10.5)
CO2: 25 mEq/L (ref 19–32)
Chloride: 102 mEq/L (ref 96–112)
Creatinine, Ser: 0.88 mg/dL (ref 0.50–1.35)
GFR calc Af Amer: >90 mL/min (ref >90)
Glucose, Bld: 96 mg/dL (ref 70–99)
Potassium: 4.1 mEq/L (ref 3.7–5.3)
SODIUM: 140 meq/L (ref 137–147)

## 2014-03-11 LAB — CBC
HCT: 40.9 % (ref 39.0–52.0)
HEMOGLOBIN: 14.1 g/dL (ref 13.0–17.0)
MCH: 26.2 pg (ref 26.0–34.0)
MCHC: 34.5 g/dL (ref 30.0–36.0)
MCV: 75.9 fL — AB (ref 78.0–100.0)
Platelets: 249 10*3/uL (ref 150–400)
RBC: 5.39 MIL/uL (ref 4.22–5.81)
RDW: 13.4 % (ref 11.5–15.5)
WBC: 5.7 10*3/uL (ref 4.0–10.5)

## 2014-03-11 NOTE — ED Notes (Signed)
Pt reports pain to bilateral flank area and into groin with discomfort with urination since yesterday. Pt also repots sob and feeling weak.

## 2014-03-12 ENCOUNTER — Emergency Department (HOSPITAL_COMMUNITY)
Admission: EM | Admit: 2014-03-12 | Discharge: 2014-03-12 | Disposition: A | Discharge status: Home / Self Care | Payer: Self-pay | Attending: Emergency Medicine | Admitting: Emergency Medicine

## 2014-03-12 DIAGNOSIS — R0602 Shortness of breath: Secondary | ICD-10-CM

## 2014-03-12 DIAGNOSIS — R109 Unspecified abdominal pain: Secondary | ICD-10-CM

## 2014-03-12 LAB — HIV ANTIBODY (ROUTINE TESTING W REFLEX): HIV: NONREACTIVE

## 2014-03-12 LAB — RPR

## 2014-03-12 NOTE — ED Notes (Signed)
Discharge instructions reviewed. Pt verbalized understanding.  

## 2014-03-12 NOTE — Discharge Instructions (Signed)
Flank Pain °Flank pain refers to pain that is located on the side of the body between the upper abdomen and the back. The pain may occur over a short period of time (acute) or may be long-term or reoccurring (chronic). It may be mild or severe. Flank pain can be caused by many things. °CAUSES  °Some of the more common causes of flank pain include: °· Muscle strains.   °· Muscle spasms.   °· A disease of your spine (vertebral disk disease).   °· A lung infection (pneumonia).   °· Fluid around your lungs (pulmonary edema).   °· A kidney infection.   °· Kidney stones.   °· A very painful skin rash caused by the chickenpox virus (shingles).   °· Gallbladder disease.   °HOME CARE INSTRUCTIONS  °Home care will depend on the cause of your pain. In general, °· Rest as directed by your caregiver. °· Drink enough fluids to keep your urine clear or pale yellow. °· Only take over-the-counter or prescription medicines as directed by your caregiver. Some medicines may help relieve the pain. °· Tell your caregiver about any changes in your pain. °· Follow up with your caregiver as directed. °SEEK IMMEDIATE MEDICAL CARE IF:  °· Your pain is not controlled with medicine.   °· You have new or worsening symptoms. °· Your pain increases.   °· You have abdominal pain.   °· You have shortness of breath.   °· You have persistent nausea or vomiting.   °· You have swelling in your abdomen.   °· You feel faint or pass out.   °· You have blood in your urine. °· You have a fever or persistent symptoms for more than 2-3 days. °· You have a fever and your symptoms suddenly get worse. °MAKE SURE YOU:  °· Understand these instructions. °· Will watch your condition. °· Will get help right away if you are not doing well or get worse. °Document Released: 07/13/2005 Document Revised: 02/14/2012 Document Reviewed: 01/04/2012 °ExitCare® Patient Information ©2015 [employer]. This information is not intended to replace advice given to you by your  health care provider. Make sure you discuss any questions you have with your health care provider. ° °

## 2014-03-12 NOTE — ED Notes (Signed)
Pt reports he had groin pain that started a few weeks ago, but today he had bilateral flank pain and sob. Pt states he was at work when sx started. Pt states his pain is 5/10. Pt denies taking any medications at home for sx.

## 2014-03-12 NOTE — ED Provider Notes (Signed)
CSN: [id number]     Arrival date & time 03/11/14  2105 History   First MD Initiated Contact with Patient 03/12/14 0125     Chief Complaint  Patient presents with  . Groin Pain  . Shortness of Breath     (Consider location/radiation/quality/duration/timing/severity/associated sxs/prior Treatment) Patient is a 28 y.o. male presenting with flank pain.  Flank Pain This is a new problem. Episode onset: today. The problem occurs constantly. The problem has been gradually improving. Associated symptoms include shortness of breath. Associated symptoms comments: Felt a little short of breath and weak earlier today. The symptoms are aggravated by bending (certain movements). The symptoms are relieved by rest.    Past Medical History  Diagnosis Date  . Heart valve regurgitation    Past Surgical History  Procedure Laterality Date  . None     Family History  Problem Relation Age of Onset  . Sudden death Neg Hx   . Hypertension Neg Hx   . Hyperlipidemia Neg Hx   . Heart attack Neg Hx   . Diabetes Neg Hx    History  Substance Use Topics  . Smoking status: Former Games developermoker  . Smokeless tobacco: Never Used     Comment: Quit 2009  . Alcohol Use: 0.0 oz/week    Review of Systems  Respiratory: Positive for shortness of breath.   Genitourinary: Positive for flank pain.  All other systems reviewed and are negative.     Allergies  Review of patient's allergies indicates no known allergies.  Home Medications   Prior to Admission medications   Medication Sig Start Date End Date Taking? Authorizing Provider  aspirin 325 MG tablet Take 325 mg by mouth daily as needed for mild pain.    Yes Historical Provider, MD   BP 115/82  Pulse 57  Temp(Src) 98.6 F (37 C) (Oral)  Resp 17  Ht 6\' 3"  (1.905 m)  Wt 200 lb (90.719 kg)  BMI 25.00 kg/m2  SpO2 99% Physical Exam  Nursing note and vitals reviewed. Constitutional: He is oriented to person, place, and time. He appears well-developed and  well-nourished. No distress.  HENT:  Head: Normocephalic and atraumatic.  Mouth/Throat: Oropharynx is clear and moist.  Eyes: Conjunctivae are normal. Pupils are equal, round, and reactive to light. No scleral icterus.  Neck: Neck supple.  Cardiovascular: Normal rate, regular rhythm, normal heart sounds and intact distal pulses.   No murmur heard. Pulmonary/Chest: Effort normal and breath sounds normal. No stridor. No respiratory distress. He has no wheezes. He has no rales.  Abdominal: Soft. He exhibits no distension. There is no tenderness.  Genitourinary: Testes normal and penis normal. Right testis shows no mass and no tenderness. Left testis shows no mass and no tenderness. Circumcised. No discharge found.  Musculoskeletal: Normal range of motion. He exhibits no edema.       Back:  Neurological: He is alert and oriented to person, place, and time.  Skin: Skin is warm and dry. No rash noted.  Psychiatric: He has a normal mood and affect. His behavior is normal.    ED Course  Procedures (including critical care time) Labs Review Labs Reviewed  CBC - Abnormal; Notable for the following:    MCV 75.9 (*)    All other components within normal limits  GC/CHLAMYDIA PROBE AMP  BASIC METABOLIC PANEL  URINALYSIS, ROUTINE W REFLEX MICROSCOPIC  RPR  HIV ANTIBODY (ROUTINE TESTING)    Imaging Review Dg Chest 2 View  03/11/2014   CLINICAL DATA:  Shortness of breath for 2 days, worsening to night. Symptoms began after exercise 2 days ago. Left-sided chest discomfort. Initial encounter.  EXAM: CHEST  2 VIEW  COMPARISON:  Two-view chest 12/01/2013.  FINDINGS: The heart size is normal. The lungs are clear. The visualized soft tissues and bony thorax are unremarkable.  IMPRESSION: Negative two view chest.   Electronically Signed   By: Gennette Pac M.D.   On: 03/11/2014 22:30  All radiology studies independently viewed by me.      EKG Interpretation   Date/Time:  Wednesday March 11 2014  21:19:35 EDT Ventricular Rate:  68 PR Interval:  166 QRS Duration: 84 QT Interval:  356 QTC Calculation: 378 R Axis:   89 Text Interpretation:  Normal sinus rhythm with sinus arrhythmia Early  repolarization Normal ECG No significant change was found Confirmed by  [facility]  MD, TREY ([id number]) on 03/12/2014 4:17:15 AM      MDM   Final diagnoses:  Flank pain    28 year old male presenting with bilateral flank pain.  I think his symptoms are likely musculoskeletal, as he started we started a workout routine 2 days ago. He also complained of some mild shortness of breath and lightheadedness earlier in the day, since resolved. He has not been drinking very much water. I advised that he increase is oral hydration.  Labs and UA are negative.  He requests STD testing, because of a lump he felt near his groin a few weeks ago. His GU exam today is normal.    Warnell Forester, MD 03/12/14 [phone]

## 2014-03-13 LAB — GC/CHLAMYDIA PROBE AMP
CT Probe RNA: NEGATIVE
GC Probe RNA: NEGATIVE

## 2014-04-10 ENCOUNTER — Emergency Department (HOSPITAL_BASED_OUTPATIENT_CLINIC_OR_DEPARTMENT_OTHER)
Admission: EM | Admit: 2014-04-10 | Discharge: 2014-04-10 | Disposition: A | Discharge status: Home / Self Care | Payer: Self-pay | Attending: Emergency Medicine | Admitting: Emergency Medicine

## 2014-04-10 ENCOUNTER — Encounter (HOSPITAL_BASED_OUTPATIENT_CLINIC_OR_DEPARTMENT_OTHER): Payer: Self-pay

## 2014-04-10 DIAGNOSIS — Z8679 Personal history of other diseases of the circulatory system: Secondary | ICD-10-CM | POA: Insufficient documentation

## 2014-04-10 DIAGNOSIS — Z8619 Personal history of other infectious and parasitic diseases: Secondary | ICD-10-CM | POA: Insufficient documentation

## 2014-04-10 DIAGNOSIS — Z952 Presence of prosthetic heart valve: Secondary | ICD-10-CM | POA: Insufficient documentation

## 2014-04-10 DIAGNOSIS — R369 Urethral discharge, unspecified: Secondary | ICD-10-CM | POA: Insufficient documentation

## 2014-04-10 DIAGNOSIS — Z87891 Personal history of nicotine dependence: Secondary | ICD-10-CM | POA: Insufficient documentation

## 2014-04-10 LAB — RPR

## 2014-04-10 LAB — HIV ANTIBODY (ROUTINE TESTING W REFLEX): HIV: NONREACTIVE

## 2014-04-10 MED ORDER — CEFTRIAXONE SODIUM 250 MG IJ SOLR
250.0000 mg | Freq: Once | INTRAMUSCULAR | Status: AC
  Administered 2014-04-10: 250 mg via INTRAMUSCULAR
  Filled 2014-04-10: qty 250

## 2014-04-10 MED ORDER — LIDOCAINE HCL (PF) 1 % IJ SOLN
INTRAMUSCULAR | Status: AC
  Administered 2014-04-10: 1.5 mL
  Filled 2014-04-10: qty 5

## 2014-04-10 MED ORDER — AZITHROMYCIN 250 MG PO TABS
1000.0000 mg | ORAL_TABLET | Freq: Once | ORAL | Status: AC
  Administered 2014-04-10: 1000 mg via ORAL
  Filled 2014-04-10: qty 4

## 2014-04-10 NOTE — ED Notes (Signed)
Reports discharge from penis x 2 days. Pt is sexually active and has had sex without a condom recently

## 2014-04-10 NOTE — Discharge Instructions (Signed)
As discussed, you have pending laboratory results.  He will be made aware of abnormal findings.  Please use the provided information to follow up with all her primary care physician as needed.  Do not hesitate to return here if you develop new, or concerning changes in your condition.

## 2014-04-10 NOTE — ED Provider Notes (Signed)
CSN: [id number]     Arrival date & time 04/10/14  1549 History   First MD Initiated Contact with Patient 04/10/14 1559     Chief Complaint  Patient presents with  . Penile Discharge     (Consider location/radiation/quality/duration/timing/severity/associated sxs/prior Treatment) HPI Patient presents with concern of ongoing penile discharge.  Discharge began 3 days ago.  Patient had unprotected sex recently. Patient has history of STD in the distant past. Since onset patient has had persistent discharge, without scrotal or penile pain.  No fever, chills, belly pain, other focal complaints.  Past Medical History  Diagnosis Date  . Heart valve regurgitation    Past Surgical History  Procedure Laterality Date  . None     Family History  Problem Relation Age of Onset  . Sudden death Neg Hx   . Hypertension Neg Hx   . Hyperlipidemia Neg Hx   . Heart attack Neg Hx   . Diabetes Neg Hx    History  Substance Use Topics  . Smoking status: Former Games developermoker  . Smokeless tobacco: Never Used     Comment: Quit 2009  . Alcohol Use: 0.0 oz/week    Review of Systems  All other systems reviewed and are negative.     Allergies  Review of patient's allergies indicates no known allergies.  Home Medications   Prior to Admission medications   Medication Sig Start Date End Date Taking? Authorizing Provider  aspirin 325 MG tablet Take 325 mg by mouth daily as needed for mild pain.     Historical Provider, MD   BP 137/74 mmHg  Pulse 75  Temp(Src) 98.7 F (37.1 C) (Oral)  Resp 18  Ht 6\' 3"  (1.905 m)  Wt 200 lb (90.719 kg)  BMI 25.00 kg/m2  SpO2 100% Physical Exam  Constitutional: He is oriented to person, place, and time. He appears well-developed. No distress.  HENT:  Head: Normocephalic and atraumatic.  Eyes: Conjunctivae and EOM are normal.  Cardiovascular: Normal rate and regular rhythm.   Pulmonary/Chest: Effort normal. No stridor. No respiratory distress.  Abdominal: He  exhibits no distension.  Genitourinary:    Discharge found.  Musculoskeletal: He exhibits no edema.  Neurological: He is alert and oriented to person, place, and time.  Skin: Skin is warm and dry.  Psychiatric: He has a normal mood and affect.  Nursing note and vitals reviewed.   ED Course  Procedures (including critical care time) Labs Review Labs Reviewed  GC/CHLAMYDIA PROBE AMP  RPR  HIV ANTIBODY (ROUTINE TESTING)   All pending on D/C   MDM   Final diagnoses:  Penile discharge    Patient presents with a new penile discharge.  Patient was treated empirically for gonorrhea, chlamydia.  On discharge patient had labs pending including HIV, syphilis. Patient voiced an understanding of the pending results, will follow up with primary care if any results are positive.    Gerhard Munchobert Aadya Kindler, MD 04/10/14 [phone]

## 2014-04-11 LAB — GC/CHLAMYDIA PROBE AMP
CT PROBE, AMP APTIMA: POSITIVE — AB
GC PROBE AMP APTIMA: POSITIVE — AB

## 2014-04-12 ENCOUNTER — Telehealth: Payer: Self-pay | Admitting: Emergency Medicine

## 2014-04-12 NOTE — Telephone Encounter (Signed)
Positive Chlamydia culture Positive Gonorrhea culture Treated with Rocephin and Zithromax per protocol MD [facility] faxed   Attempt to contact patient, left voicemail to call office #

## 2014-04-12 NOTE — Telephone Encounter (Signed)
P returned call, ID verified x three. Notified of positive gonorrhea and chlamydia cultures and that treatment was given while in ED with Rocephin and Zithromax. STD instructions given. Patient verbalized understanding.

## 2014-11-26 ENCOUNTER — Encounter (HOSPITAL_BASED_OUTPATIENT_CLINIC_OR_DEPARTMENT_OTHER): Payer: Self-pay

## 2014-11-26 ENCOUNTER — Emergency Department (HOSPITAL_BASED_OUTPATIENT_CLINIC_OR_DEPARTMENT_OTHER)
Admission: EM | Admit: 2014-11-26 | Discharge: 2014-11-26 | Disposition: A | Discharge status: Home / Self Care | Payer: Self-pay | Attending: Emergency Medicine | Admitting: Emergency Medicine

## 2014-11-26 DIAGNOSIS — Z8679 Personal history of other diseases of the circulatory system: Secondary | ICD-10-CM | POA: Insufficient documentation

## 2014-11-26 DIAGNOSIS — B349 Viral infection, unspecified: Secondary | ICD-10-CM | POA: Insufficient documentation

## 2014-11-26 DIAGNOSIS — R531 Weakness: Secondary | ICD-10-CM

## 2014-11-26 DIAGNOSIS — Z7982 Long term (current) use of aspirin: Secondary | ICD-10-CM | POA: Insufficient documentation

## 2014-11-26 DIAGNOSIS — Z87891 Personal history of nicotine dependence: Secondary | ICD-10-CM | POA: Insufficient documentation

## 2014-11-26 LAB — BASIC METABOLIC PANEL
Anion gap: 8 (ref 5–15)
BUN: 11 mg/dL (ref 6–20)
CHLORIDE: 101 mmol/L (ref 101–111)
CO2: 29 mmol/L (ref 22–32)
Calcium: 9.8 mg/dL (ref 8.9–10.3)
Creatinine, Ser: 1.03 mg/dL (ref 0.61–1.24)
Glucose, Bld: 102 mg/dL — ABNORMAL HIGH (ref 65–99)
POTASSIUM: 4 mmol/L (ref 3.5–5.1)
SODIUM: 138 mmol/L (ref 135–145)

## 2014-11-26 LAB — CBC WITH DIFFERENTIAL/PLATELET
Basophils Absolute: 0 10*3/uL (ref 0.0–0.1)
Basophils Relative: 0 % (ref 0–1)
EOS PCT: 3 % (ref 0–5)
Eosinophils Absolute: 0.1 10*3/uL (ref 0.0–0.7)
HEMATOCRIT: 40.8 % (ref 39.0–52.0)
Hemoglobin: 14.1 g/dL (ref 13.0–17.0)
LYMPHS ABS: 1.3 10*3/uL (ref 0.7–4.0)
LYMPHS PCT: 27 % (ref 12–46)
MCH: 26.6 pg (ref 26.0–34.0)
MCHC: 34.6 g/dL (ref 30.0–36.0)
MCV: 77 fL — AB (ref 78.0–100.0)
Monocytes Absolute: 0.5 10*3/uL (ref 0.1–1.0)
Monocytes Relative: 11 % (ref 3–12)
NEUTROS ABS: 2.8 10*3/uL (ref 1.7–7.7)
Neutrophils Relative %: 59 % (ref 43–77)
PLATELETS: 230 10*3/uL (ref 150–400)
RBC: 5.3 MIL/uL (ref 4.22–5.81)
RDW: 13.4 % (ref 11.5–15.5)
WBC: 4.7 10*3/uL (ref 4.0–10.5)

## 2014-11-26 LAB — RAPID STREP SCREEN (MED CTR MEBANE ONLY): STREPTOCOCCUS, GROUP A SCREEN (DIRECT): NEGATIVE

## 2014-11-26 NOTE — Discharge Instructions (Signed)
Drink plenty of fluids and get plenty of rest.  Return to the emergency department if symptoms significantly worsen or change.   Fatigue Fatigue is a feeling of tiredness, lack of energy, lack of motivation, or feeling tired all the time. Having enough rest, good nutrition, and reducing stress will normally reduce fatigue. Consult your caregiver if it persists. The nature of your fatigue will help your caregiver to find out its cause. The treatment is based on the cause.  CAUSES  There are many causes for fatigue. Most of the time, fatigue can be traced to one or more of your habits or routines. Most causes fit into one or more of three general areas. They are: Lifestyle problems  Sleep disturbances.  Overwork.  Physical exertion.  Unhealthy habits.  Poor eating habits or eating disorders.  Alcohol and/or drug use .  Lack of proper nutrition (malnutrition). Psychological problems  Stress and/or anxiety problems.  Depression.  Grief.  Boredom. Medical Problems or Conditions  Anemia.  Pregnancy.  Thyroid gland problems.  Recovery from major surgery.  Continuous pain.  Emphysema or asthma that is not well controlled  Allergic conditions.  Diabetes.  Infections (such as mononucleosis).  Obesity.  Sleep disorders, such as sleep apnea.  Heart failure or other heart-related problems.  Cancer.  Kidney disease.  Liver disease.  Effects of certain medicines such as antihistamines, cough and cold remedies, prescription pain medicines, heart and blood pressure medicines, drugs used for treatment of cancer, and some antidepressants. SYMPTOMS  The symptoms of fatigue include:   Lack of energy.  Lack of drive (motivation).  Drowsiness.  Feeling of indifference to the surroundings. DIAGNOSIS  The details of how you feel help guide your caregiver in finding out what is causing the fatigue. You will be asked about your present and past health condition. It  is important to review all medicines that you take, including prescription and non-prescription items. A thorough exam will be done. You will be questioned about your feelings, habits, and normal lifestyle. Your caregiver may suggest blood tests, urine tests, or other tests to look for common medical causes of fatigue.  TREATMENT  Fatigue is treated by correcting the underlying cause. For example, if you have continuous pain or depression, treating these causes will improve how you feel. Similarly, adjusting the dose of certain medicines will help in reducing fatigue.  HOME CARE INSTRUCTIONS   Try to get the required amount of good sleep every night.  Eat a healthy and nutritious diet, and drink enough water throughout the day.  Practice ways of relaxing (including yoga or meditation).  Exercise regularly.  Make plans to change situations that cause stress. Act on those plans so that stresses decrease over time. Keep your work and personal routine reasonable.  Avoid street drugs and minimize use of alcohol.  Start taking a daily multivitamin after consulting your caregiver. SEEK MEDICAL CARE IF:   You have persistent tiredness, which cannot be accounted for.  You have fever.  You have unintentional weight loss.  You have headaches.  You have disturbed sleep throughout the night.  You are feeling sad.  You have constipation.  You have dry skin.  You have gained weight.  You are taking any new or different medicines that you suspect are causing fatigue.  You are unable to sleep at night.  You develop any unusual swelling of your legs or other parts of your body. SEEK IMMEDIATE MEDICAL CARE IF:   You are feeling confused.  Your vision is blurred.  You feel faint or pass out.  You develop severe headache.  You develop severe abdominal, pelvic, or back pain.  You develop chest pain, shortness of breath, or an irregular or fast heartbeat.  You are unable to pass a  normal amount of urine.  You develop abnormal bleeding such as bleeding from the rectum or you vomit blood.  You have thoughts about harming yourself or committing suicide.  You are worried that you might harm someone else. MAKE SURE YOU:   Understand these instructions.  Will watch your condition.  Will get help right away if you are not doing well or get worse. Document Released: 03/19/2007 Document Revised: 08/14/2011 Document Reviewed: 09/23/2013 [facility] Patient Information 2015 [place], [place]. This information is not intended to replace advice given to you by your health care provider. Make sure you discuss any questions you have with your health care provider.

## 2014-11-26 NOTE — ED Notes (Signed)
Pt reports fatigue and weakness  Since yesterday.  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  States he has had muscle soreness in bilateral bicep area.  Reported cough.

## 2014-11-26 NOTE — ED Provider Notes (Signed)
CSN: [id number]     Arrival date & time 11/26/14  1235 History   First MD Initiated Contact with Patient 11/26/14 1249     Chief Complaint  Patient presents with  . Fatigue     (Consider location/radiation/quality/duration/timing/severity/associated sxs/prior Treatment) HPI Comments: Patient is a 29 year old male who presents with complaints of fatigue. He states over the past 2 days he has had no energy and having difficulty getting through his work duties. He states that he had somewhat of a scratchy throat 2 days ago. He also is experiencing a cough but denies chest pain or fever.  The history is provided by the patient.    Past Medical History  Diagnosis Date  . Heart valve regurgitation    Past Surgical History  Procedure Laterality Date  . None     Family History  Problem Relation Age of Onset  . Sudden death Neg Hx   . Hypertension Neg Hx   . Hyperlipidemia Neg Hx   . Heart attack Neg Hx   . Diabetes Neg Hx    History  Substance Use Topics  . Smoking status: Former Games developer  . Smokeless tobacco: Never Used     Comment: Quit 2009  . Alcohol Use: 0.0 oz/week    Review of Systems  All other systems reviewed and are negative.     Allergies  Review of patient's allergies indicates no known allergies.  Home Medications   Prior to Admission medications   Medication Sig Start Date End Date Taking? Authorizing Provider  aspirin 325 MG tablet Take 325 mg by mouth daily as needed for mild pain.     Historical Provider, MD   BP 136/68 mmHg  Pulse 80  Temp(Src) 98.5 F (36.9 C) (Oral)  Resp 16  Ht 6\' 3"  (1.905 m)  Wt 200 lb (90.719 kg)  BMI 25.00 kg/m2  SpO2 100% Physical Exam  Constitutional: He is oriented to person, place, and time. He appears well-developed and well-nourished. No distress.  HENT:  Head: Normocephalic and atraumatic.  Mouth/Throat: Oropharynx is clear and moist.  Neck: Normal range of motion. Neck supple.  Cardiovascular: Normal rate,  regular rhythm and normal heart sounds.   No murmur heard. Pulmonary/Chest: Effort normal and breath sounds normal. No respiratory distress.  Abdominal: Soft. Bowel sounds are normal.  Musculoskeletal: Normal range of motion. He exhibits no edema.  Lymphadenopathy:    He has no cervical adenopathy.  Neurological: He is alert and oriented to person, place, and time.  Skin: Skin is warm and dry. He is not diaphoretic.  Nursing note and vitals reviewed.   ED Course  Procedures (including critical care time) Labs Review Labs Reviewed  RAPID STREP SCREEN (NOT AT [facility])  BASIC METABOLIC PANEL  CBC WITH DIFFERENTIAL/PLATELET    Imaging Review No results found.   EKG Interpretation None      MDM   Final diagnoses:  None    Patient presents with complaints of weakness. His physical examination is unremarkable and laboratory studies and strep test are all normal. I suspect a viral etiology. He will be discharged home with instructions to follow-up with his primary Dr. if not improving.    Geoffery Lyons, MD 11/26/14 [phone]

## 2014-11-29 LAB — CULTURE, GROUP A STREP

## 2015-09-13 ENCOUNTER — Emergency Department (HOSPITAL_BASED_OUTPATIENT_CLINIC_OR_DEPARTMENT_OTHER)
Admission: EM | Admit: 2015-09-13 | Discharge: 2015-09-13 | Disposition: A | Discharge status: Home / Self Care | Payer: Self-pay | Attending: Emergency Medicine | Admitting: Emergency Medicine

## 2015-09-13 ENCOUNTER — Encounter (HOSPITAL_BASED_OUTPATIENT_CLINIC_OR_DEPARTMENT_OTHER): Payer: Self-pay | Admitting: *Deleted

## 2015-09-13 DIAGNOSIS — A64 Unspecified sexually transmitted disease: Secondary | ICD-10-CM | POA: Insufficient documentation

## 2015-09-13 DIAGNOSIS — Z7982 Long term (current) use of aspirin: Secondary | ICD-10-CM | POA: Insufficient documentation

## 2015-09-13 DIAGNOSIS — R51 Headache: Secondary | ICD-10-CM | POA: Insufficient documentation

## 2015-09-13 DIAGNOSIS — Z87891 Personal history of nicotine dependence: Secondary | ICD-10-CM | POA: Insufficient documentation

## 2015-09-13 DIAGNOSIS — Z8679 Personal history of other diseases of the circulatory system: Secondary | ICD-10-CM | POA: Insufficient documentation

## 2015-09-13 LAB — URINALYSIS, ROUTINE W REFLEX MICROSCOPIC
Bilirubin Urine: NEGATIVE
GLUCOSE, UA: NEGATIVE mg/dL
Ketones, ur: NEGATIVE mg/dL
Nitrite: NEGATIVE
PH: 5.5 (ref 5.0–8.0)
Protein, ur: NEGATIVE mg/dL
Specific Gravity, Urine: 1.013 (ref 1.005–1.030)

## 2015-09-13 LAB — URINE MICROSCOPIC-ADD ON

## 2015-09-13 MED ORDER — AZITHROMYCIN 250 MG PO TABS
1000.0000 mg | ORAL_TABLET | Freq: Once | ORAL | Status: AC
  Administered 2015-09-13: 1000 mg via ORAL
  Filled 2015-09-13: qty 4

## 2015-09-13 MED ORDER — LIDOCAINE HCL (PF) 1 % IJ SOLN
INTRAMUSCULAR | Status: AC
  Administered 2015-09-13: 1.2 mL
  Filled 2015-09-13: qty 5

## 2015-09-13 MED ORDER — CEFTRIAXONE SODIUM 250 MG IJ SOLR
250.0000 mg | Freq: Once | INTRAMUSCULAR | Status: AC
  Administered 2015-09-13: 250 mg via INTRAMUSCULAR
  Filled 2015-09-13: qty 250

## 2015-09-13 NOTE — ED Notes (Signed)
Pt given d/c instructions. Verbalizes understanding. No questions. No reaction to meds.

## 2015-09-13 NOTE — ED Provider Notes (Signed)
CSN: [id number]     Arrival date & time 09/13/15  2037 History   First MD Initiated Contact with Patient 09/13/15 2146     No chief complaint on file.    (Consider location/radiation/quality/duration/timing/severity/associated sxs/prior Treatment) HPI Comments: Penile discharge for 3 days. Unprotected sex.  Patient is a 30 y.o. male presenting with STD exposure. The history is provided by the patient. No language interpreter was used.  Exposure to STD This is a new problem. The current episode started in the past 7 days. The problem occurs constantly. The problem has been unchanged. Associated symptoms include headaches.    Past Medical History  Diagnosis Date  . Heart valve regurgitation    Past Surgical History  Procedure Laterality Date  . None     Family History  Problem Relation Age of Onset  . Sudden death Neg Hx   . Hypertension Neg Hx   . Hyperlipidemia Neg Hx   . Heart attack Neg Hx   . Diabetes Neg Hx    Social History  Substance Use Topics  . Smoking status: Former Games developer  . Smokeless tobacco: Never Used     Comment: Quit 2009  . Alcohol Use: 0.0 oz/week    Review of Systems  Genitourinary: Positive for discharge.  Neurological: Positive for headaches.  All other systems reviewed and are negative.     Allergies  Review of patient's allergies indicates no known allergies.  Home Medications   Prior to Admission medications   Medication Sig Start Date End Date Taking? Authorizing Provider  aspirin 325 MG tablet Take 325 mg by mouth daily as needed for mild pain.     Historical Provider, MD   BP 136/69 mmHg  Pulse 67  Temp(Src) 98.4 F (36.9 C) (Oral)  Resp 18  Ht  (1.905 m)  Wt 90.719 kg  BMI 25.00 kg/m2  SpO2 100% Physical Exam  Constitutional: He is oriented to person, place, and time. He appears well-developed and well-nourished.  HENT:  Head: Normocephalic.  Eyes: Pupils are equal, round, and reactive to light.  Neck: Normal range  of motion.  Cardiovascular: Normal rate and regular rhythm.   Pulmonary/Chest: Effort normal and breath sounds normal.  Abdominal: Soft. Bowel sounds are normal.  Genitourinary: Cremasteric reflex is present. Right testis shows no tenderness. Left testis shows no tenderness. Circumcised. Discharge found.  Musculoskeletal: He exhibits no edema or tenderness.  Lymphadenopathy:    He has no cervical adenopathy.       Right: Inguinal adenopathy present.       Left: No inguinal adenopathy present.  Neurological: He is alert and oriented to person, place, and time.  Skin: Skin is warm and dry.  Psychiatric: He has a normal mood and affect.  Nursing note and vitals reviewed.   ED Course  Procedures (including critical care time) Labs Review Labs Reviewed  URINALYSIS, ROUTINE W REFLEX MICROSCOPIC (NOT AT [facility]) - Abnormal; Notable for the following:    APPearance CLOUDY (*)    Hgb urine dipstick SMALL (*)    Leukocytes, UA LARGE (*)    All other components within normal limits  URINE MICROSCOPIC-ADD ON - Abnormal; Notable for the following:    Squamous Epithelial / LPF 0-5 (*)    Bacteria, UA RARE (*)    All other components within normal limits    Imaging Review No results found. I have personally reviewed and evaluated these images and lab results as part of my medical decision-making.   EKG Interpretation  None      MDM   Final diagnoses:  None  Patient treated in the ED for STI with rocephin and azithromycin. Patient advised to inform and treat all sexual partners.  Pt advised on safe sex practices and understands that they have GC/Chlamydia cultures pending and will result in 2-3 days. HIV and RPR sent. Pt encouraged to follow up at local health department for future STI checks. No concern for prostatitis or epididymitis. Discussed return precautions. Pt appears safe for discharge.       Felicie Mornavid Andrian Urbach, NP 09/14/15 [id number]  Linwood DibblesJon Knapp, MD 09/15/15 [phone]

## 2015-09-13 NOTE — ED Notes (Signed)
Pt c/o yellow d/c x couple days. Denies other s/s.

## 2015-09-13 NOTE — Discharge Instructions (Signed)
Sexually Transmitted Disease °A sexually transmitted disease (STD) is a disease or infection that may be passed (transmitted) from person to person, usually during sexual activity. This may happen by way of saliva, semen, blood, vaginal mucus, or urine. Common STDs include: °· Gonorrhea. °· Chlamydia. °· Syphilis. °· HIV and AIDS. °· Genital herpes. °· Hepatitis B and C. °· Trichomonas. °· Human papillomavirus (HPV). °· Pubic lice. °· Scabies. °· Mites. °· Bacterial vaginosis. °WHAT ARE CAUSES OF STDs? °An STD may be caused by bacteria, a virus, or parasites. STDs are often transmitted during sexual activity if one person is infected. However, they may also be transmitted through nonsexual means. STDs may be transmitted after:  °· Sexual intercourse with an infected person. °· Sharing sex toys with an infected person. °· Sharing needles with an infected person or using unclean piercing or tattoo needles. °· Having intimate contact with the genitals, mouth, or rectal areas of an infected person. °· Exposure to infected fluids during birth. °WHAT ARE THE SIGNS AND SYMPTOMS OF STDs? °Different STDs have different symptoms. Some people may not have any symptoms. If symptoms are present, they may include: °· Painful or bloody urination. °· Pain in the pelvis, abdomen, vagina, anus, throat, or eyes. °· A skin rash, itching, or irritation. °· Growths, ulcerations, blisters, or sores in the genital and anal areas. °· Abnormal vaginal discharge with or without bad odor. °· Penile discharge in men. °· Fever. °· Pain or bleeding during sexual intercourse. °· Swollen glands in the groin area. °· Yellow skin and eyes (jaundice). This is seen with hepatitis. °· Swollen testicles. °· Infertility. °· Sores and blisters in the mouth. °HOW ARE STDs DIAGNOSED? °To make a diagnosis, your health care provider may: °· Take a medical history. °· Perform a physical exam. °· Take a sample of any discharge to examine. °· Swab the throat,  cervix, opening to the penis, rectum, or vagina for testing. °· Test a sample of your first morning urine. °· Perform blood tests. °· Perform a Pap test, if this applies. °· Perform a colposcopy. °· Perform a laparoscopy. °HOW ARE STDs TREATED? °Treatment depends on the STD. Some STDs may be treated but not cured. °· Chlamydia, gonorrhea, trichomonas, and syphilis can be cured with antibiotic medicine. °· Genital herpes, hepatitis, and HIV can be treated, but not cured, with prescribed medicines. The medicines lessen symptoms. °· Genital warts from HPV can be treated with medicine or by freezing, burning (electrocautery), or surgery. Warts may come back. °· HPV cannot be cured with medicine or surgery. However, abnormal areas may be removed from the cervix, vagina, or vulva. °· If your diagnosis is confirmed, your recent sexual partners need treatment. This is true even if they are symptom-free or have a negative culture or evaluation. They should not have sex until their health care providers say it is okay. °· Your health care provider may test you for infection again 3 months after treatment. °HOW CAN I REDUCE MY RISK OF GETTING AN STD? °Take these steps to reduce your risk of getting an STD: °· Use latex condoms, dental dams, and water-soluble lubricants during sexual activity. Do not use petroleum jelly or oils. °· Avoid having multiple sex partners. °· Do not have sex with someone who has other sex partners °· Do not have sex with anyone you do not know or who is at high risk for an STD. °· Avoid risky sex practices that can break your skin. °· Do not have sex   if you have open sores on your mouth or skin. °· Avoid drinking too much alcohol or taking illegal drugs. Alcohol and drugs can affect your judgment and put you in a vulnerable position. °· Avoid engaging in oral and anal sex acts. °· Get vaccinated for HPV and hepatitis. If you have not received these vaccines in the past, talk to your health care  provider about whether one or both might be right for you. °· If you are at risk of being infected with HIV, it is recommended that you take a prescription medicine daily to prevent HIV infection. This is called pre-exposure prophylaxis (PrEP). You are considered at risk if: °¨ You are a man who has sex with other men (MSM). °¨ You are a heterosexual man or woman and are sexually active with more than one partner. °¨ You take drugs by injection. °¨ You are sexually active with a partner who has HIV. °· Talk with your health care provider about whether you are at high risk of being infected with HIV. If you choose to begin PrEP, you should first be tested for HIV. You should then be tested every 3 months for as long as you are taking PrEP. °WHAT SHOULD I DO IF I THINK I HAVE AN STD? °· See your health care provider. °· Tell your sexual partner(s). They should be tested and treated for any STDs. °· Do not have sex until your health care provider says it is okay. °WHEN SHOULD I GET IMMEDIATE MEDICAL CARE? °Contact your health care provider right away if:  °· You have severe abdominal pain. °· You are a man and notice swelling or pain in your testicles. °· You are a woman and notice swelling or pain in your vagina. °  °This information is not intended to replace advice given to you by your health care provider. Make sure you discuss any questions you have with your health care provider. °  °Document Released: 08/12/2002 Document Revised: 06/12/2014 Document Reviewed: 12/10/2012 °Elsevier Interactive Patient Education ©2016 [employer] ° °

## 2015-09-13 NOTE — ED Notes (Signed)
Penile discharge x 2 days 

## 2015-09-15 LAB — HIV ANTIBODY (ROUTINE TESTING W REFLEX): HIV SCREEN 4TH GENERATION: NONREACTIVE

## 2015-09-15 LAB — GC/CHLAMYDIA PROBE AMP (~~LOC~~) NOT AT ARMC
Chlamydia: NEGATIVE
NEISSERIA GONORRHEA: POSITIVE — AB

## 2015-09-15 LAB — RPR: RPR Ser Ql: NONREACTIVE

## 2015-09-16 ENCOUNTER — Telehealth (HOSPITAL_BASED_OUTPATIENT_CLINIC_OR_DEPARTMENT_OTHER): Payer: Self-pay | Admitting: Emergency Medicine

## 2016-01-05 ENCOUNTER — Encounter (HOSPITAL_BASED_OUTPATIENT_CLINIC_OR_DEPARTMENT_OTHER): Payer: Self-pay | Admitting: *Deleted

## 2016-01-05 ENCOUNTER — Emergency Department (HOSPITAL_BASED_OUTPATIENT_CLINIC_OR_DEPARTMENT_OTHER)
Admission: EM | Admit: 2016-01-05 | Discharge: 2016-01-05 | Disposition: A | Discharge status: Home / Self Care | Payer: BLUE CROSS/BLUE SHIELD | Attending: Physician Assistant | Admitting: Physician Assistant

## 2016-01-05 DIAGNOSIS — R079 Chest pain, unspecified: Secondary | ICD-10-CM

## 2016-01-05 DIAGNOSIS — R0789 Other chest pain: Secondary | ICD-10-CM | POA: Insufficient documentation

## 2016-01-05 DIAGNOSIS — Z7982 Long term (current) use of aspirin: Secondary | ICD-10-CM | POA: Insufficient documentation

## 2016-01-05 DIAGNOSIS — Z87891 Personal history of nicotine dependence: Secondary | ICD-10-CM | POA: Insufficient documentation

## 2016-01-05 MED ORDER — CYCLOBENZAPRINE HCL 10 MG PO TABS: 10.0000 mg | ORAL_TABLET | Freq: Two times a day (BID) | ORAL | 0 refills | Status: DC | PRN

## 2016-01-05 MED ORDER — IBUPROFEN 800 MG PO TABS: 800.0000 mg | ORAL_TABLET | Freq: Three times a day (TID) | ORAL | 0 refills | Status: DC

## 2016-01-05 MED ORDER — IBUPROFEN 800 MG PO TABS
800.0000 mg | ORAL_TABLET | Freq: Once | ORAL | Status: AC
  Administered 2016-01-05: 800 mg via ORAL
  Filled 2016-01-05: qty 1

## 2016-01-05 MED FILL — IBUPROFEN 800 MG TABLET: 800 | 7 days supply | Qty: 21 | Fill #0

## 2016-01-05 MED FILL — CYCLOBENZAPRINE 10 MG TAB: 10 | 5 days supply | Qty: 10 | Fill #0

## 2016-01-05 NOTE — ED Notes (Signed)
Pt given crackers and peanut butter and sprite  

## 2016-01-05 NOTE — ED Triage Notes (Signed)
Pt reports onset of left chest pain this am, increases with positioning and movements, non tender, pain increases with deep inspiration. ekg performed while pt being triaged.

## 2016-01-05 NOTE — ED Provider Notes (Signed)
[facility] Provider Note   CSN: [id number] Arrival date & time: 01/05/16  7829  First Provider Contact:  First MD Initiated Contact with Patient 01/05/16 [phone]        History   Chief Complaint Chief Complaint  Patient presents with  . Chest Pain    HPI VIRGIN MAKOWSKI is a 30 y.o. male.  HPI   Patient is a 30 year old male presenting with pain to his left flank. Patient has been seen for chest pain in the past, had an exercise stress test 2 years ago that was negative. Patient says he often gets real anxious about symptoms that he has.. Patient said the pain started after he slept on the couch last night. He woke up this morning and had a sharp pain to the left side when he moves his arms. No diaphoresis does not travel to the left arm or up to the jaw. No chest pain.  Past Medical History:  Diagnosis Date  . Heart valve regurgitation     Patient Active Problem List   Diagnosis Date Noted  . Chest pain 01/07/2014  . SOB (shortness of breath) 01/07/2014  . Palpitation 01/07/2014  . Right hamstring muscle strain 09/11/2013  . OTHER DYSPNEA AND RESPIRATORY ABNORMALITIES 09/17/2009    Past Surgical History:  Procedure Laterality Date  . None         Home Medications    Prior to Admission medications   Medication Sig Start Date End Date Taking? Authorizing Provider  aspirin 325 MG tablet Take 325 mg by mouth daily as needed for mild pain.     Historical Provider, MD    Family History Family History  Problem Relation Age of Onset  . Sudden death Neg Hx   . Hypertension Neg Hx   . Hyperlipidemia Neg Hx   . Heart attack Neg Hx   . Diabetes Neg Hx     Social History Social History  Substance Use Topics  . Smoking status: Former Games developer  . Smokeless tobacco: Never Used     Comment: Quit 2009  . Alcohol use 0.0 oz/week     Allergies   Review of patient's allergies indicates no known allergies.   Review of Systems Review of Systems    Constitutional: Negative for activity change.  Respiratory: Negative for shortness of breath and wheezing.   Cardiovascular: Negative for chest pain and palpitations.       Left-sided chest wall pain.  Gastrointestinal: Negative for abdominal pain.     Physical Exam Updated Vital Signs BP 131/70 (BP Location: Left Arm)   Pulse 61   Temp 97.7 F (36.5 C) (Oral)   Resp 18   Ht 6\' 3"  (1.905 m)   Wt 200 lb (90.7 kg)   SpO2 100%   BMI 25.00 kg/m   Physical Exam  Constitutional: He is oriented to person, place, and time. He appears well-nourished.  HENT:  Head: Normocephalic.  Mouth/Throat: Oropharynx is clear and moist.  Eyes: Conjunctivae are normal.  Neck: No tracheal deviation present.  Cardiovascular: Normal rate.   Pulmonary/Chest: Effort normal. No stridor. No respiratory distress.  Pain with palpation to the left lateral chest wall. Pain when patient twists or moves.  Musculoskeletal: Normal range of motion. He exhibits no edema.  Neurological: He is oriented to person, place, and time. No cranial nerve deficit.  Skin: Skin is warm and dry. No rash noted. He is not diaphoretic.  Psychiatric: He has a normal mood and affect. His behavior  is normal.  Nursing note and vitals reviewed.    ED Treatments / Results  Labs (all labs ordered are listed, but only abnormal results are displayed) Labs Reviewed - No data to display  EKG  EKG Interpretation  Date/Time:  Wednesday January 05 2016 07:26:19 EDT Ventricular Rate:  62 PR Interval:    QRS Duration: 73 QT Interval:  363 QTC Calculation: 369 R Axis:   84 Text Interpretation:  Sinus rhythm Probable left ventricular hypertrophy ST elev, probable normal early repol pattern Baseline wander in lead(s) V2 No significant change since last tracing Benign early repolarization Confirmed by Kandis Mannan ([id number]) on 01/05/2016 7:30:56 AM       Radiology No results found.  Procedures Procedures (including critical  care time)  Medications Ordered in ED Medications  ibuprofen (ADVIL,MOTRIN) tablet 800 mg (not administered)     Initial Impression / Assessment and Plan / ED Course  I have reviewed the triage vital signs and the nursing notes.  Pertinent labs & imaging results that were available during my care of the patient were reviewed by me and considered in my medical decision making (see chart for details).  Clinical Course    Patient is a 30 year old male presenting with left chest wall pain. This started after sleeping on the couch last night. This been going on for less than 1 hour. It hurts when he moves or twists. I believe this is muscle skeletal pain. Patient has had workup for cardiac etiology in the past including a negative stress test 2 years ago. Patient's EKG shows BER.  We'll treat his MSK pain with ibuprofen Flexeril have patient follow up with primary as needed..  Final Clinical Impressions(s) / ED Diagnoses   Final diagnoses:  None    New Prescriptions New Prescriptions   No medications on file     Trentyn Boisclair Randall An, MD 01/05/16 [phone]

## 2016-01-05 NOTE — Discharge Instructions (Signed)
Please return if pain gets worse or you have any other complaints.

## 2016-03-23 ENCOUNTER — Encounter (HOSPITAL_BASED_OUTPATIENT_CLINIC_OR_DEPARTMENT_OTHER): Payer: Self-pay | Admitting: *Deleted

## 2016-03-23 ENCOUNTER — Emergency Department (HOSPITAL_BASED_OUTPATIENT_CLINIC_OR_DEPARTMENT_OTHER)
Admission: EM | Admit: 2016-03-23 | Discharge: 2016-03-23 | Disposition: A | Discharge status: Home / Self Care | Payer: BLUE CROSS/BLUE SHIELD | Attending: Emergency Medicine | Admitting: Emergency Medicine

## 2016-03-23 DIAGNOSIS — Z87891 Personal history of nicotine dependence: Secondary | ICD-10-CM | POA: Insufficient documentation

## 2016-03-23 DIAGNOSIS — R002 Palpitations: Secondary | ICD-10-CM | POA: Insufficient documentation

## 2016-03-23 LAB — CBC WITH DIFFERENTIAL/PLATELET
BASOS ABS: 0 10*3/uL (ref 0.0–0.1)
Basophils Relative: 0 %
EOS ABS: 0 10*3/uL (ref 0.0–0.7)
EOS PCT: 1 %
HCT: 41.5 % (ref 39.0–52.0)
Hemoglobin: 14.2 g/dL (ref 13.0–17.0)
Lymphocytes Relative: 18 %
Lymphs Abs: 0.7 10*3/uL (ref 0.7–4.0)
MCH: 26.1 pg (ref 26.0–34.0)
MCHC: 34.2 g/dL (ref 30.0–36.0)
MCV: 76.3 fL — AB (ref 78.0–100.0)
MONO ABS: 0.4 10*3/uL (ref 0.1–1.0)
Monocytes Relative: 9 %
Neutro Abs: 2.9 10*3/uL (ref 1.7–7.7)
Neutrophils Relative %: 72 %
PLATELETS: 229 10*3/uL (ref 150–400)
RBC: 5.44 MIL/uL (ref 4.22–5.81)
RDW: 13.7 % (ref 11.5–15.5)
WBC: 4 10*3/uL (ref 4.0–10.5)

## 2016-03-23 LAB — BASIC METABOLIC PANEL
Anion gap: 5 (ref 5–15)
BUN: 12 mg/dL (ref 6–20)
CO2: 28 mmol/L (ref 22–32)
CREATININE: 0.97 mg/dL (ref 0.61–1.24)
Calcium: 9.4 mg/dL (ref 8.9–10.3)
Chloride: 106 mmol/L (ref 101–111)
GFR calc Af Amer: >60 mL/min (ref >60)
GLUCOSE: 105 mg/dL — AB (ref 65–99)
Potassium: 3.9 mmol/L (ref 3.5–5.1)
SODIUM: 139 mmol/L (ref 135–145)

## 2016-03-23 LAB — RAPID HIV SCREEN (HIV 1/2 AB+AG)
HIV 1/2 ANTIBODIES: NONREACTIVE
HIV-1 P24 ANTIGEN - HIV24: NONREACTIVE

## 2016-03-23 NOTE — ED Provider Notes (Signed)
[facility]   CSN: [id number] Arrival date & time: 03/23/16  1041     History   Chief Complaint Chief Complaint  Patient presents with  . Palpitations    HPI Duane Banks is a 30 y.o. male.  HPI patient presents with two-week history of on again off again palpitation.  Also is had some swollen lymph nodes in his groins.  No fever.  No chills.  No definite urethral discharge.  Has history of GC and Chlamydia infections in the past.  Is sexually active and has had unprotected intercourse since his last treatment.   Past Medical History:  Diagnosis Date  . Heart valve regurgitation     Patient Active Problem List   Diagnosis Date Noted  . Chest pain 01/07/2014  . SOB (shortness of breath) 01/07/2014  . Palpitation 01/07/2014  . Right hamstring muscle strain 09/11/2013  . OTHER DYSPNEA AND RESPIRATORY ABNORMALITIES 09/17/2009    Past Surgical History:  Procedure Laterality Date  . None         Home Medications    Prior to Admission medications   Medication Sig Start Date End Date Taking? Authorizing Provider  aspirin 325 MG tablet Take 325 mg by mouth daily as needed for mild pain.    Yes Historical Provider, MD  cyclobenzaprine (FLEXERIL) 10 MG tablet Take 1 tablet (10 mg total) by mouth 2 (two) times daily as needed for muscle spasms. 01/05/16   Courteney Lyn Mackuen, MD  ibuprofen (ADVIL,MOTRIN) 800 MG tablet Take 1 tablet (800 mg total) by mouth 3 (three) times daily. 01/05/16   Courteney Lyn Mackuen, MD    Family History Family History  Problem Relation Age of Onset  . Sudden death Neg Hx   . Hypertension Neg Hx   . Hyperlipidemia Neg Hx   . Heart attack Neg Hx   . Diabetes Neg Hx     Social History Social History  Substance Use Topics  . Smoking status: Former Games developermoker  . Smokeless tobacco: Never Used     Comment: Quit 2009  . Alcohol use 0.0 oz/week     Allergies   Review of patient's allergies indicates no known  allergies.   Review of Systems Review of Systems All other systems reviewed and are negative  Physical Exam Updated Vital Signs BP 142/74 (BP Location: Right Arm)   Pulse 69   Temp 98.2 F (36.8 C) (Oral)   Resp 18   Ht 6\' 3"  (1.905 m)   Wt 200 lb (90.7 kg)   SpO2 100%   BMI 25.00 kg/m   Physical Exam Physical Exam  Nursing note and vitals reviewed. Constitutional: He is oriented to person, place, and time. He appears well-developed and well-nourished. No distress.  HENT:  Head: Normocephalic and atraumatic.  Eyes: Pupils are equal, round, and reactive to light.  Neck: Normal range of motion.  Cardiovascular: Normal rate and intact distal pulses.   Pulmonary/Chest: No respiratory distress.  Abdominal: Normal appearance. He exhibits no distension.  Musculoskeletal: Normal range of motion.  Neurological: He is alert and oriented to person, place, and time. No cranial nerve deficit.  Genitourinary: Patient has bilateral lymphadenopathy in both groins.  No urethral discharge.  Scrotum appears normal.  No evidence of chancers Skin: Skin is warm and dry. No rash noted.  Psychiatric: He has a normal mood and affect. His behavior is normal.    ED Treatments / Results  Labs (all labs ordered are listed, but only abnormal  results are displayed) Labs Reviewed  BASIC METABOLIC PANEL - Abnormal; Notable for the following:       Result Value   Glucose, Bld 105 (*)    All other components within normal limits  CBC WITH DIFFERENTIAL/PLATELET - Abnormal; Notable for the following:    MCV 76.3 (*)    All other components within normal limits  RAPID HIV SCREEN (HIV 1/2 AB+AG)  RPR  GC/CHLAMYDIA PROBE AMP ([place]) NOT AT [facility]    EKG  EKG Interpretation  Date/Time:  Thursday March 23 2016 11:04:03 EDT Ventricular Rate:  63 PR Interval:    QRS Duration: 83 QT Interval:  363 QTC Calculation: 372 R Axis:   83 Text Interpretation:  Sinus rhythm ST elev, probable normal  early repol pattern Baseline wander in lead(s) V3 No significant change since last tracing Confirmed by Mailynn Everly  MD, Crescent Gotham ([id number]) on 03/23/2016 11:20:24 AM Also confirmed by Radford Pax  MD, Steele Ledonne ([id number]), editor [place]  CCT, BEVERLY ([id number])  on 03/23/2016 11:49:30 AM       Radiology No results found.  Procedures Procedures (including critical care time)  Medications Ordered in ED Medications - No data to display   Initial Impression / Assessment and Plan / ED Course  I have reviewed the triage vital signs and the nursing notes.  Pertinent labs & imaging results that were available during my care of the patient were reviewed by me and considered in my medical decision making (see chart for details).  Clinical Course      Final Clinical Impressions(s) / ED Diagnoses   Final diagnoses:  Palpitations    New Prescriptions New Prescriptions   No medications on file     Nelva Nay, MD 03/23/16 1308

## 2016-03-23 NOTE — ED Triage Notes (Signed)
For 2 weeks he has felt palpations and sob. Swollen glands in his groin. Does not know if he has had a fever.

## 2016-03-23 NOTE — ED Notes (Signed)
Denies any GU symptoms last date of unprotected sex was two weeks ago. Wants STD screen.

## 2016-03-23 NOTE — ED Notes (Signed)
MD at bedside. 

## 2016-03-24 LAB — RPR: RPR: NONREACTIVE

## 2016-03-24 LAB — GC/CHLAMYDIA PROBE AMP (~~LOC~~) NOT AT ARMC
CHLAMYDIA, DNA PROBE: NEGATIVE
NEISSERIA GONORRHEA: NEGATIVE

## 2016-06-07 ENCOUNTER — Encounter (HOSPITAL_COMMUNITY): Payer: Self-pay | Admitting: Emergency Medicine

## 2016-06-07 ENCOUNTER — Emergency Department (HOSPITAL_COMMUNITY)
Admission: EM | Admit: 2016-06-07 | Discharge: 2016-06-08 | Disposition: A | Discharge status: Home / Self Care | Payer: BLUE CROSS/BLUE SHIELD | Attending: Emergency Medicine | Admitting: Emergency Medicine

## 2016-06-07 DIAGNOSIS — Z87891 Personal history of nicotine dependence: Secondary | ICD-10-CM | POA: Insufficient documentation

## 2016-06-07 DIAGNOSIS — Z7982 Long term (current) use of aspirin: Secondary | ICD-10-CM | POA: Insufficient documentation

## 2016-06-07 DIAGNOSIS — R0789 Other chest pain: Secondary | ICD-10-CM | POA: Insufficient documentation

## 2016-06-07 DIAGNOSIS — R42 Dizziness and giddiness: Secondary | ICD-10-CM

## 2016-06-07 DIAGNOSIS — R079 Chest pain, unspecified: Secondary | ICD-10-CM

## 2016-06-07 NOTE — ED Provider Notes (Signed)
[facility] Provider Note   CSN: [id number] Arrival date & time: 06/07/16  2332  By signing my name below, I, Nelwyn SalisburyJoshua Fowler, attest that this documentation has been prepared under the direction and in the presence of Zadie Rhineonald Cabell Lazenby, MD . Electronically Signed: Nelwyn SalisburyJoshua Fowler, Scribe. 06/07/2016. 11:52 PM.  History   Chief Complaint Chief Complaint  Patient presents with  . Dizziness  . Chest Pain  . Shortness of Breath   The history is provided by the patient. No language interpreter was used.  Dizziness  Quality:  Lightheadedness Severity:  Mild Onset quality:  Sudden Duration:  1 hour Timing:  Constant Progression:  Unchanged Chronicity:  New Relieved by:  Nothing Worsened by:  Nothing Ineffective treatments:  Being still and change in position Associated symptoms: chest pain, palpitations, shortness of breath and weakness   Associated symptoms: no diarrhea, no nausea and no vomiting     HPI Comments:  Duane Banks is a 31 y.o. male brought in by ambulance, who presents to the Emergency Department complaining of sudden-onset unchanged moderate SOB beginning about 2 hours ago. 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 Pt tried chewing some aspirin and sitting down and resting which did not alleviate his symptoms. He denies any nausea, vomiting, diarrhea, syncope, abdominal pain or lower extremity swelling. Pt notes that he has not been eating as frequently lately. He also states that he feels his symptoms may have something to do with his blood pressure.   Past Medical History:  Diagnosis Date  . Heart valve regurgitation     Patient Active Problem List   Diagnosis Date Noted  . Chest pain 01/07/2014  . SOB (shortness of breath) 01/07/2014  . Palpitation 01/07/2014  . Right hamstring muscle strain 09/11/2013  . OTHER  DYSPNEA AND RESPIRATORY ABNORMALITIES 09/17/2009    Past Surgical History:  Procedure Laterality Date  . None         Home Medications    Prior to Admission medications   Medication Sig Start Date End Date Taking? Authorizing Provider  aspirin 325 MG tablet Take 325 mg by mouth daily as needed for mild pain.     Historical Provider, MD  cyclobenzaprine (FLEXERIL) 10 MG tablet Take 1 tablet (10 mg total) by mouth 2 (two) times daily as needed for muscle spasms. 01/05/16   Courteney Lyn Mackuen, MD  ibuprofen (ADVIL,MOTRIN) 800 MG tablet Take 1 tablet (800 mg total) by mouth 3 (three) times daily. 01/05/16   Courteney Lyn Mackuen, MD    Family History Family History  Problem Relation Age of Onset  . Sudden death Neg Hx   . Hypertension Neg Hx   . Hyperlipidemia Neg Hx   . Heart attack Neg Hx   . Diabetes Neg Hx     Social History Social History  Substance Use Topics  . Smoking status: Former Games developermoker  . Smokeless tobacco: Never Used     Comment: Quit 2009  . Alcohol use 0.0 oz/week     Allergies   Patient has no known allergies.   Review of Systems Review of Systems  Constitutional: Positive for chills and fatigue.  Respiratory: Positive for chest tightness and shortness of breath.   Cardiovascular: Positive for chest pain and palpitations. Negative for leg swelling.  Gastrointestinal: Negative for abdominal pain, diarrhea, nausea and vomiting.  Neurological: Positive for dizziness  and weakness. Negative for syncope.  All other systems reviewed and are negative.    Physical Exam Updated Vital Signs Pulse 60   Temp 98.5 F (36.9 C) (Oral)   Resp 14   SpO2 100%   Physical Exam CONSTITUTIONAL: Well developed/well nourished HEAD: Normocephalic/atraumatic EYES: EOMI/PERRL ENMT: Mucous membranes moist NECK: supple no meningeal signs SPINE/BACK:entire spine nontender CV: S1/S2 noted, no murmurs/rubs/gallops noted, no loud murmurs noted LUNGS: Lungs are clear to  auscultation bilaterally, no apparent distress ABDOMEN: soft, nontender, no rebound or guarding, bowel sounds noted throughout abdomen. Mild tenderness to bilateral inguinal crease. Mild lymphadenopathy. No erythema noted. Chaperone present for exam GU:no cva tenderness NEURO: Pt is awake/alert/appropriate, moves all extremitiesx4.  No facial droop.   EXTREMITIES: pulses normal/equal, full ROM. No calf tenderness.  SKIN: warm, color normal PSYCH: no abnormalities of mood noted, alert and oriented to situation   ED Treatments / Results  DIAGNOSTIC STUDIES:  Oxygen Saturation is 100% on RA, normal by my interpretation.    COORDINATION OF CARE:  12:05 AM Discussed treatment plan with pt at bedside which includes monitoring and expressed need for PCP follow-up for enlarged inguinal lymph nodes and pt agreed to plan.  This can be done over the next month  Labs (all labs ordered are listed, but only abnormal results are displayed) Labs Reviewed  BASIC METABOLIC PANEL - Abnormal; Notable for the following:       Result Value   Potassium 3.4 (*)    All other components within normal limits  CBC - Abnormal; Notable for the following:    MCV 77.6 (*)    All other components within normal limits  I-STAT TROPOININ, ED    EKG  EKG Interpretation  Date/Time:  Wednesday June 07 2016 23:40:32 EST Ventricular Rate:  67 PR Interval:    QRS Duration: 87 QT Interval:  371 QTC Calculation: 392 R Axis:   71 Text Interpretation:  Sinus rhythm RSR' in V1 or V2, probably normal variant ST elev, probable normal early repol pattern No significant change since last tracing Confirmed by Bebe Shaggy  MD, Adel Burch ([id number]) on 06/08/2016 12:09:03 AM       Radiology Dg Chest 2 View  Result Date: 06/08/2016 CLINICAL DATA:  Chest pain for 2 days EXAM: CHEST  2 VIEW COMPARISON:  03/11/2014 FINDINGS: The heart size and mediastinal contours are within normal limits. Both lungs are clear. The visualized skeletal  structures are unremarkable. IMPRESSION: No active cardiopulmonary disease. Electronically Signed   By: Ellery Plunk M.D.   On: 06/08/2016 00:51    Procedures Procedures (including critical care time)  Medications Ordered in ED Medications - No data to display   Initial Impression / Assessment and Plan / ED Course  I have reviewed the triage vital signs and the nursing notes.  Pertinent labs & imaging results that were available during my care of the patient were reviewed by me and considered in my medical decision making (see chart for details).  Clinical Course     Pt stable Well appearing, no distress Workup unremarkable Doubt ACS/PE He appears PERC negative Will d/c home No EKG changes Discussed return precautions  Final Clinical Impressions(s) / ED Diagnoses   Final diagnoses:  Dizziness  Chest pain, unspecified type    New Prescriptions New Prescriptions   No medications on file  I personally performed the services described in this documentation, which was scribed in my presence. The recorded information has been reviewed and is accurate.  Zadie Rhine, MD 06/08/16 [phone]

## 2016-06-07 NOTE — ED Triage Notes (Signed)
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 Denies any N/V. Pain was initially a 7 but has now subsided. Per EMS pt was seen in [place] on Sunday for similar complaints and is supposed to be setting appointments with PCP for HTN follow up.

## 2016-06-08 ENCOUNTER — Emergency Department (HOSPITAL_COMMUNITY): Payer: BLUE CROSS/BLUE SHIELD

## 2016-06-08 LAB — CBC
HEMATOCRIT: 39.4 % (ref 39.0–52.0)
Hemoglobin: 13.3 g/dL (ref 13.0–17.0)
MCH: 26.2 pg (ref 26.0–34.0)
MCHC: 33.8 g/dL (ref 30.0–36.0)
MCV: 77.6 fL — AB (ref 78.0–100.0)
Platelets: 207 10*3/uL (ref 150–400)
RBC: 5.08 MIL/uL (ref 4.22–5.81)
RDW: 13.9 % (ref 11.5–15.5)
WBC: 5.6 10*3/uL (ref 4.0–10.5)

## 2016-06-08 LAB — BASIC METABOLIC PANEL
Anion gap: 7 (ref 5–15)
BUN: 9 mg/dL (ref 6–20)
CHLORIDE: 103 mmol/L (ref 101–111)
CO2: 27 mmol/L (ref 22–32)
Calcium: 9.1 mg/dL (ref 8.9–10.3)
Creatinine, Ser: 1 mg/dL (ref 0.61–1.24)
GFR calc Af Amer: >60 mL/min (ref >60)
GFR calc non Af Amer: >60 mL/min (ref >60)
GLUCOSE: 93 mg/dL (ref 65–99)
POTASSIUM: 3.4 mmol/L — AB (ref 3.5–5.1)
Sodium: 137 mmol/L (ref 135–145)

## 2016-06-08 LAB — I-STAT TROPONIN, ED: Troponin i, poc: 0 ng/mL (ref 0.00–0.08)

## 2016-06-08 NOTE — Discharge Instructions (Signed)

## 2016-06-27 IMAGING — CR DG CHEST 2V
2 series · 2 of 2 positions shown · non-contrast
Comparison: Two-view chest 12/01/2013.

CLINICAL DATA: Shortness of breath for 2 days, worsening to night.
Symptoms began after exercise 2 days ago. Left-sided chest
discomfort. Initial encounter.

EXAM:
CHEST  2 VIEW

[w chest pa]
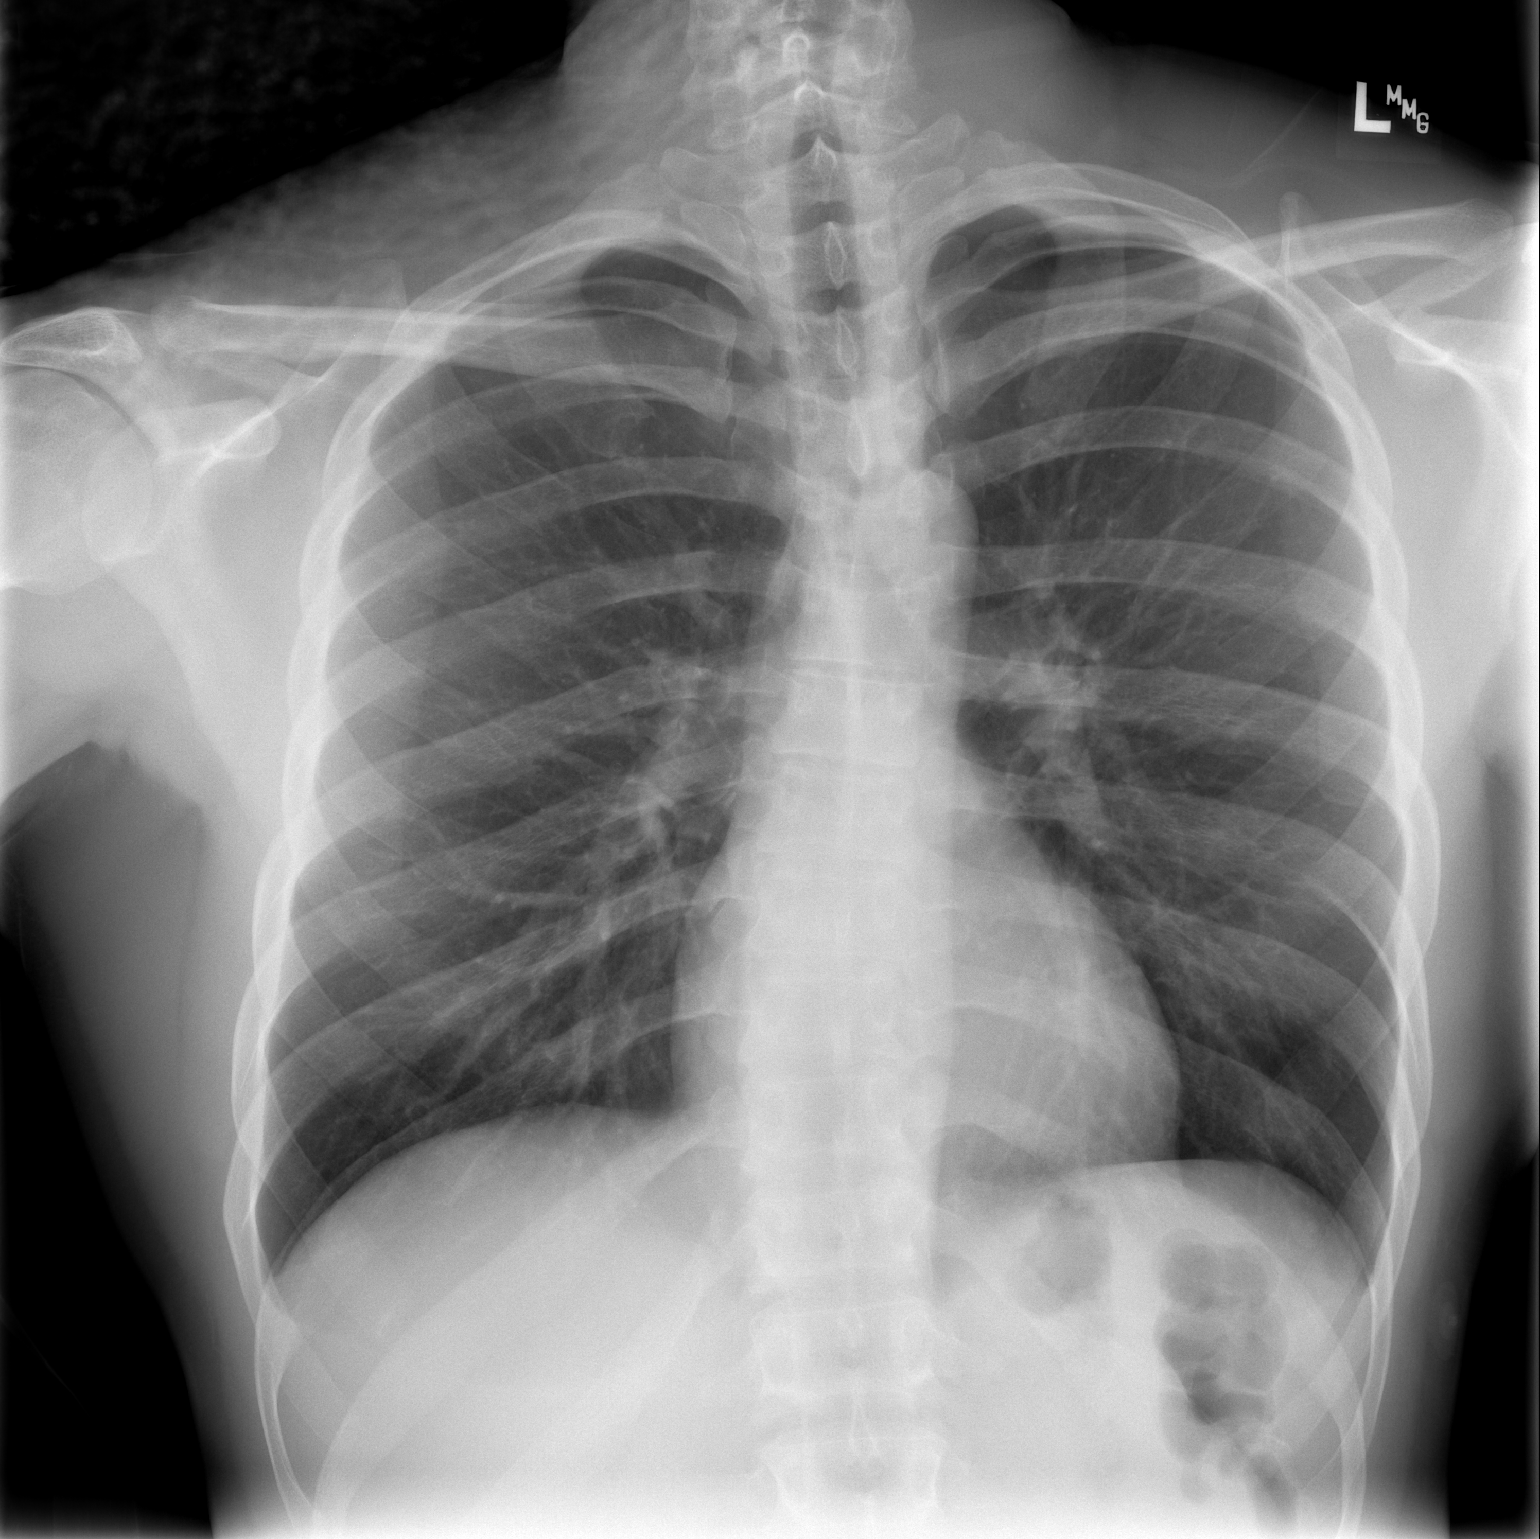

[w chest lat]
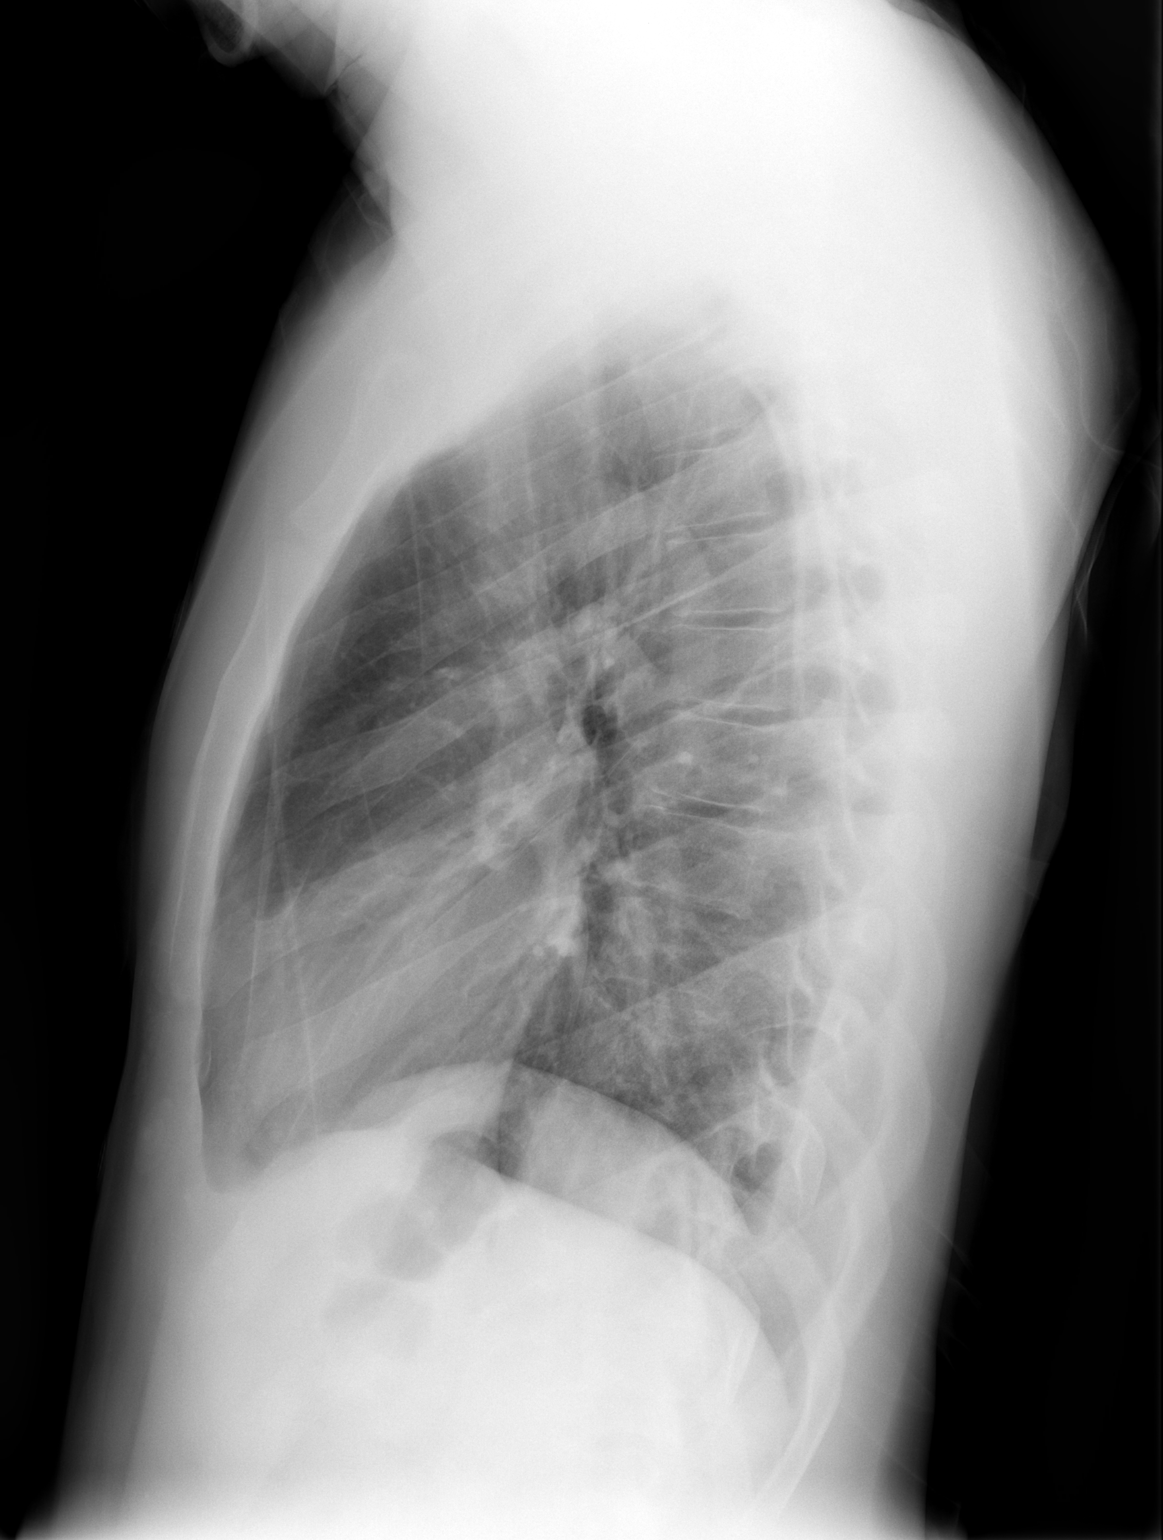

[2 of 2 positions shown; findings below may reference images not displayed]

FINDINGS: The heart size is normal. The lungs are clear. The visualized soft
tissues and bony thorax are unremarkable.
IMPRESSION: Negative two view chest.

## 2016-07-27 ENCOUNTER — Other Ambulatory Visit: Payer: Self-pay | Admitting: Family Medicine

## 2016-07-27 DIAGNOSIS — R1032 Left lower quadrant pain: Secondary | ICD-10-CM

## 2016-08-04 ENCOUNTER — Ambulatory Visit
Admission: RE | Admit: 2016-08-04 | Discharge: 2016-08-04 | Disposition: A | Discharge status: Home / Self Care | Payer: BLUE CROSS/BLUE SHIELD | Source: Ambulatory Visit | Attending: Family Medicine | Admitting: Family Medicine

## 2016-08-04 DIAGNOSIS — R1032 Left lower quadrant pain: Secondary | ICD-10-CM

## 2016-09-05 ENCOUNTER — Other Ambulatory Visit: Payer: Self-pay | Admitting: Family Medicine

## 2016-09-05 DIAGNOSIS — R102 Pelvic and perineal pain: Secondary | ICD-10-CM

## 2016-09-19 ENCOUNTER — Ambulatory Visit
Admission: RE | Admit: 2016-09-19 | Discharge: 2016-09-19 | Disposition: A | Discharge status: Home / Self Care | Payer: 59 | Source: Ambulatory Visit | Attending: Family Medicine | Admitting: Family Medicine

## 2016-09-19 DIAGNOSIS — R102 Pelvic and perineal pain: Secondary | ICD-10-CM

## 2017-01-18 ENCOUNTER — Encounter (HOSPITAL_BASED_OUTPATIENT_CLINIC_OR_DEPARTMENT_OTHER): Payer: Self-pay

## 2017-01-18 ENCOUNTER — Emergency Department (HOSPITAL_BASED_OUTPATIENT_CLINIC_OR_DEPARTMENT_OTHER)
Admission: EM | Admit: 2017-01-18 | Discharge: 2017-01-18 | Disposition: A | Discharge status: Home / Self Care | Payer: 59 | Attending: Physician Assistant | Admitting: Physician Assistant

## 2017-01-18 DIAGNOSIS — R531 Weakness: Secondary | ICD-10-CM | POA: Diagnosis not present

## 2017-01-18 DIAGNOSIS — F17200 Nicotine dependence, unspecified, uncomplicated: Secondary | ICD-10-CM | POA: Diagnosis not present

## 2017-01-18 HISTORY — DX: Other specified symptoms and signs involving the circulatory and respiratory systems: R09.89

## 2017-01-18 LAB — CBC WITH DIFFERENTIAL/PLATELET
BASOS PCT: 0 %
Basophils Absolute: 0 10*3/uL (ref 0.0–0.1)
EOS ABS: 0.1 10*3/uL (ref 0.0–0.7)
Eosinophils Relative: 2 %
HCT: 37.4 % — ABNORMAL LOW (ref 39.0–52.0)
Hemoglobin: 12.9 g/dL — ABNORMAL LOW (ref 13.0–17.0)
Lymphocytes Relative: 33 %
Lymphs Abs: 1.5 10*3/uL (ref 0.7–4.0)
MCH: 26.1 pg (ref 26.0–34.0)
MCHC: 34.5 g/dL (ref 30.0–36.0)
MCV: 75.7 fL — ABNORMAL LOW (ref 78.0–100.0)
MONO ABS: 0.5 10*3/uL (ref 0.1–1.0)
MONOS PCT: 11 %
Neutro Abs: 2.5 10*3/uL (ref 1.7–7.7)
Neutrophils Relative %: 54 %
Platelets: 228 10*3/uL (ref 150–400)
RBC: 4.94 MIL/uL (ref 4.22–5.81)
RDW: 13.9 % (ref 11.5–15.5)
WBC: 4.6 10*3/uL (ref 4.0–10.5)

## 2017-01-18 LAB — BASIC METABOLIC PANEL
ANION GAP: 9 (ref 5–15)
BUN: 13 mg/dL (ref 6–20)
CALCIUM: 9.2 mg/dL (ref 8.9–10.3)
CHLORIDE: 105 mmol/L (ref 101–111)
CO2: 26 mmol/L (ref 22–32)
CREATININE: 1.07 mg/dL (ref 0.61–1.24)
GFR calc Af Amer: >60 mL/min (ref >60)
GFR calc non Af Amer: >60 mL/min (ref >60)
GLUCOSE: 100 mg/dL — AB (ref 65–99)
Potassium: 4.1 mmol/L (ref 3.5–5.1)
Sodium: 140 mmol/L (ref 135–145)

## 2017-01-18 LAB — TROPONIN I

## 2017-01-18 LAB — TSH: TSH: 0.999 u[IU]/mL (ref 0.350–4.500)

## 2017-01-18 LAB — MONONUCLEOSIS SCREEN: MONO SCREEN: NEGATIVE

## 2017-01-18 NOTE — ED Notes (Signed)
amb to BR w/o difficulty 

## 2017-01-18 NOTE — ED Triage Notes (Signed)
C/o weakness x 2 weeks-SOB x today-NAD-steady gait

## 2017-01-18 NOTE — Discharge Instructions (Signed)
As discussed, your labs looked normal today and your exam was reassuring. Make sure that you staty well-hydrated and follow up with your primary care provider.  Return if you experience worsening of symptoms, dizziness, shortness of breath, chest pain, visual disturbances, bad headache, or any other new concerning symptoms in the meantime.

## 2017-01-18 NOTE — ED Notes (Signed)
Pt reports feeling fatigued x1 month which is worse today. Pt reports being ShOB, but describes it as if he can't get a full breath in. No obvious difficulty. Pt reports getting about 5 hours of sleep at night. Pt reports being under more stress over the last month. Pt endorses making a move, car trouble, family, and financial stress.

## 2017-01-19 NOTE — ED Provider Notes (Signed)
[facility]   CSN: [id number] Arrival date & time: 01/18/17  1817     History   Chief Complaint Chief Complaint  Patient presents with  . Weakness    HPI Duane Banks is a 31 y.o. male presenting with 2 weeks of feeling generally weak and fatigued. He reports overexerting himself on Tuesday and today was so tired that he felt he had to be evaluated because he felt weak and all his muscles. He also notes shortness of breath on exertion today. Not short of breath at this time. Patient reports recent CT scan for inguinal nodes. He is scheduled to see a specialist in urology for this. He denies any penile discharge, dysuria, fever, chills, congestion.  HPI  Past Medical History:  Diagnosis Date  . Heart valve regurgitation   . Lymph node symptom     Patient Active Problem List   Diagnosis Date Noted  . Chest pain 01/07/2014  . SOB (shortness of breath) 01/07/2014  . Palpitation 01/07/2014  . Right hamstring muscle strain 09/11/2013  . OTHER DYSPNEA AND RESPIRATORY ABNORMALITIES 09/17/2009    Past Surgical History:  Procedure Laterality Date  . None         Home Medications    Prior to Admission medications   Not on File    Family History Family History  Problem Relation Age of Onset  . Sudden death Neg Hx   . Hypertension Neg Hx   . Hyperlipidemia Neg Hx   . Heart attack Neg Hx   . Diabetes Neg Hx     Social History Social History  Substance Use Topics  . Smoking status: Current Every Day Smoker  . Smokeless tobacco: Never Used  . Alcohol use 0.0 oz/week     Comment: occ     Allergies   Patient has no known allergies.   Review of Systems Review of Systems  Constitutional: Positive for fatigue. Negative for chills, diaphoresis and fever.  HENT: Positive for sore throat. Negative for congestion and ear pain.        Patient reports a sore throat that has now improved.  Eyes: Negative for pain and visual  disturbance.  Respiratory: Positive for shortness of breath. Negative for cough, choking, chest tightness, wheezing and stridor.        SOB on exertion not at this time.  Cardiovascular: Negative for chest pain, palpitations and leg swelling.  Gastrointestinal: Negative for abdominal distention, abdominal pain, blood in stool, constipation, diarrhea, nausea and vomiting.  Genitourinary: Negative for difficulty urinating, discharge, dysuria, flank pain, genital sores, hematuria, penile pain and penile swelling.  Musculoskeletal: Negative for arthralgias, back pain, gait problem, joint swelling, myalgias, neck pain and neck stiffness.  Skin: Negative for color change, pallor and rash.  Neurological: Positive for weakness. Negative for dizziness, seizures, syncope, speech difficulty, light-headedness, numbness and headaches.     Physical Exam Updated Vital Signs BP 127/61   Pulse 60   Temp 98.5 F (36.9 C) (Oral)   Resp 16   Ht 6\' 3"  (1.905 m)   Wt 96.6 kg (213 lb)   SpO2 99%   BMI 26.62 kg/m   Physical Exam  Constitutional: He is oriented to person, place, and time. He appears well-developed and well-nourished. No distress.  Afebrile, nontoxic-appearing, sitting comfortable in bed in no acute distress.  HENT:  Head: Normocephalic and atraumatic.  Mouth/Throat: Oropharynx is clear and moist. No oropharyngeal exudate.  Eyes: Pupils are equal, round, and reactive to  light. Conjunctivae and EOM are normal. Right eye exhibits no discharge. Left eye exhibits no discharge. No scleral icterus.  Neck: Normal range of motion. Neck supple.  Cardiovascular: Normal rate, regular rhythm, normal heart sounds and intact distal pulses.   No murmur heard. Pulmonary/Chest: Effort normal and breath sounds normal. No respiratory distress. He has no wheezes. He has no rales. He exhibits no tenderness.  Abdominal: Soft. He exhibits no distension and no mass. There is no tenderness. There is no guarding.    No CVA tenderness  Musculoskeletal: Normal range of motion. He exhibits no edema, tenderness or deformity.  Lymphadenopathy:    He has no cervical adenopathy.  Neurological: He is alert and oriented to person, place, and time. No cranial nerve deficit or sensory deficit. He exhibits normal muscle tone. Coordination normal.  Neurologic Exam:  - Mental status: Patient is alert and cooperative. Fluent speech and words are clear. Coherent thought processes and insight is good. Patient is oriented x 4 to person, place, time and event.  - Cranial nerves:  CN III, IV, VI: pupils equally round, reactive to light both direct and conscensual. Full extra-ocular movement. CN V: motor temporalis and masseter strength intact. CN VII : muscles of facial expression intact. CN X :  midline uvula. XI strength of sternocleidomastoid and trapezius muscles 5/5, XII: tongue is midline when protruded. - Motor: No involuntary movements. Muscle tone and bulk normal throughout. Muscle strength is 5/5 in bilateral shoulder abduction, elbow flexion and extension, grip, hip extension, flexion, leg flexion and extension, ankle dorsiflexion and plantar flexion.  - Sensory: Proprioception, light tough sensation intact in all extremities.  - Cerebellar: rapid alternating movements and point to point movement intact in upper and lower extremities. Normal stance and gait.  Skin: Skin is warm and dry. No rash noted. He is not diaphoretic. No erythema. No pallor.  Psychiatric: He has a normal mood and affect.  Nursing note and vitals reviewed.    ED Treatments / Results  Labs (all labs ordered are listed, but only abnormal results are displayed) Labs Reviewed  BASIC METABOLIC PANEL - Abnormal; Notable for the following:       Result Value   Glucose, Bld 100 (*)    All other components within normal limits  CBC WITH DIFFERENTIAL/PLATELET - Abnormal; Notable for the following:    Hemoglobin 12.9 (*)    HCT 37.4 (*)    MCV  75.7 (*)    All other components within normal limits  TROPONIN I  MONONUCLEOSIS SCREEN  TSH  HIV ANTIBODY (ROUTINE TESTING)  RPR    EKG  EKG Interpretation  Date/Time:  Thursday January 18 2017 19:16:30 EDT Ventricular Rate:  66 PR Interval:    QRS Duration: 76 QT Interval:  367 QTC Calculation: 385 R Axis:   75 Text Interpretation:  Sinus rhythm ST elev, probable normal early repol pattern No significant change since last tracing Confirmed by Corlis Leak, Courteney ([id number]) on 01/18/2017 7:22:35 PM       Radiology No results found.  Procedures Procedures (including critical care time)  Medications Ordered in ED Medications - No data to display   Initial Impression / Assessment and Plan / ED Course  I have reviewed the triage vital signs and the nursing notes.  Pertinent labs & imaging results that were available during my care of the patient were reviewed by me and considered in my medical decision making (see chart for details).    Patient presenting with generalized fatigue and  multiple vague complaints. Workup unremarkable.  He was afebrile nontoxic and well-appearing. Normal Neuro exam. He has equal strength 5 out of 5 bilaterally in all extremities.  Mono negative  Obtained TSH and HIV labs. Normal vital signs and stable. Patient was advised to follow-up with his PCP.  Discussed strict return precautions and advised to return to the emergency department if experiencing any new or worsening symptoms. Instructions were understood and patient agreed with discharge plan.  Final Clinical Impressions(s) / ED Diagnoses   Final diagnoses:  Generalized weakness    New Prescriptions There are no discharge medications for this patient.    Georgiana Shore, PA-C 01/19/17 0252    Abelino Derrick, MD 01/24/17 2149

## 2017-01-20 LAB — HIV ANTIBODY (ROUTINE TESTING W REFLEX): HIV Screen 4th Generation wRfx: NONREACTIVE

## 2017-01-20 LAB — RPR: RPR Ser Ql: NONREACTIVE

## 2017-10-26 ENCOUNTER — Encounter (HOSPITAL_BASED_OUTPATIENT_CLINIC_OR_DEPARTMENT_OTHER): Payer: Self-pay

## 2017-10-26 ENCOUNTER — Other Ambulatory Visit: Payer: Self-pay

## 2017-10-26 ENCOUNTER — Emergency Department (HOSPITAL_BASED_OUTPATIENT_CLINIC_OR_DEPARTMENT_OTHER)
Admission: EM | Admit: 2017-10-26 | Discharge: 2017-10-26 | Disposition: A | Discharge status: Home / Self Care | Payer: 59 | Attending: Emergency Medicine | Admitting: Emergency Medicine

## 2017-10-26 ENCOUNTER — Emergency Department (HOSPITAL_BASED_OUTPATIENT_CLINIC_OR_DEPARTMENT_OTHER): Payer: 59

## 2017-10-26 DIAGNOSIS — J069 Acute upper respiratory infection, unspecified: Secondary | ICD-10-CM | POA: Insufficient documentation

## 2017-10-26 DIAGNOSIS — F17200 Nicotine dependence, unspecified, uncomplicated: Secondary | ICD-10-CM | POA: Insufficient documentation

## 2017-10-26 DIAGNOSIS — B9789 Other viral agents as the cause of diseases classified elsewhere: Secondary | ICD-10-CM | POA: Insufficient documentation

## 2017-10-26 LAB — RAPID STREP SCREEN (MED CTR MEBANE ONLY): Streptococcus, Group A Screen (Direct): NEGATIVE

## 2017-10-26 MED ORDER — CETIRIZINE HCL 10 MG PO TABS: 10.0000 mg | ORAL_TABLET | Freq: Every day | ORAL | 1 refills | Status: AC

## 2017-10-26 MED ORDER — FLUTICASONE PROPIONATE 50 MCG/ACT NA SUSP: 1.0000 | Freq: Every day | NASAL | 2 refills | Status: DC

## 2017-10-26 MED ORDER — SALINE SPRAY 0.65 % NA SOLN: 1.0000 | NASAL | 0 refills | Status: DC | PRN

## 2017-10-26 NOTE — ED Notes (Signed)
Patient transported to X-ray 

## 2017-10-26 NOTE — ED Triage Notes (Signed)
Pt reports congestion/ cough x 4 days. States , :"coughed up a lot of mucous earlier and there was blood in it".

## 2017-10-26 NOTE — ED Provider Notes (Signed)
[facility] EMERGENCY DEPARTMENT Provider Note   CSN: [id number] Arrival date & time: 10/26/17  8295     History   Chief Complaint Chief Complaint  Patient presents with  . Cough    HPI Duane Banks is a 32 y.o. male with no pertinent past medical history presenting with 4 days of productive cough, nasal congestion which all started with a sore throat.  Patient denies any known ill contacts but his 69-year-old son is in daycare and had some vomiting recently.  He has tried Alka-Seltzer with some relief.  He was concerned because he saw streaks of blood in his sputum.  States that this came from postnasal drip. Denies any known seasonal allergies and has not taken antihistamines.  No chest pain or shortness of breath.  HPI  Past Medical History:  Diagnosis Date  . Heart valve regurgitation   . Lymph node symptom     Patient Active Problem List   Diagnosis Date Noted  . Chest pain 01/07/2014  . SOB (shortness of breath) 01/07/2014  . Palpitation 01/07/2014  . Right hamstring muscle strain 09/11/2013  . OTHER DYSPNEA AND RESPIRATORY ABNORMALITIES 09/17/2009    Past Surgical History:  Procedure Laterality Date  . None          Home Medications    Prior to Admission medications   Medication Sig Start Date End Date Taking? Authorizing Provider  cetirizine (ZYRTEC) 10 MG tablet Take 1 tablet (10 mg total) by mouth daily. 10/26/17   Mathews Robinsons B, PA-C  fluticasone (FLONASE) 50 MCG/ACT nasal spray Place 1 spray into both nostrils daily. 10/26/17   Mathews Robinsons B, PA-C  sodium chloride (OCEAN) 0.65 % SOLN nasal spray Place 1 spray into both nostrils as needed for congestion. 10/26/17   Georgiana Shore, PA-C    Family History Family History  Problem Relation Age of Onset  . Sudden death Neg Hx   . Hypertension Neg Hx   . Hyperlipidemia Neg Hx   . Heart attack Neg Hx   . Diabetes Neg Hx     Social History Social History   Tobacco Use  .  Smoking status: Current Every Day Smoker  . Smokeless tobacco: Never Used  Substance Use Topics  . Alcohol use: Yes    Alcohol/week: 0.0 oz    Comment: occ  . Drug use: No     Allergies   Patient has no known allergies.   Review of Systems Review of Systems  Constitutional: Negative for activity change, appetite change, chills, diaphoresis, fatigue and fever.  HENT: Positive for congestion, sinus pressure and sore throat. Negative for ear pain, facial swelling, tinnitus, trouble swallowing and voice change.   Eyes: Negative for photophobia, redness and visual disturbance.  Respiratory: Positive for cough. Negative for choking, chest tightness, shortness of breath, wheezing and stridor.   Cardiovascular: Negative for chest pain and palpitations.  Gastrointestinal: Negative for abdominal pain, diarrhea, nausea and vomiting.  Genitourinary: Negative for difficulty urinating and dysuria.  Musculoskeletal: Negative for arthralgias, myalgias and neck pain.  Skin: Negative for color change, pallor and rash.  Neurological: Negative for dizziness and light-headedness.     Physical Exam Updated Vital Signs BP (!) 121/59 (BP Location: Right Arm)   Pulse 76   Temp 98.7 F (37.1 C) (Oral)   Resp 18   Ht  (1.905 m)   Wt 95.3 kg (210 lb)   SpO2 99%   BMI 26.25 kg/m   Physical Exam  Constitutional: He is oriented to person, place, and time. He appears well-developed and well-nourished. No distress.  Well-appearing, nontoxic afebrile sitting comfortably in bed no acute distress.  HENT:  Head: Normocephalic and atraumatic.  Right Ear: External ear normal.  Left Ear: External ear normal.  Mouth/Throat: No oropharyngeal exudate.  Oropharynx is erythematous with petechiae.  No tonsillar edema or exudate.  Tolerating oral secretions.  Normal tympanic membranes bilaterally.  No cervical adenopathy  Eyes: Conjunctivae and EOM are normal. Right eye exhibits no discharge. Left eye  exhibits no discharge.  Neck: Normal range of motion. Neck supple.  Cardiovascular: Normal rate, regular rhythm, normal heart sounds and intact distal pulses.  No murmur heard. Pulmonary/Chest: Effort normal and breath sounds normal. No stridor. No respiratory distress. He has no wheezes. He has no rales.  Lungs are clear to auscultation bilaterally  Abdominal: He exhibits no distension.  Musculoskeletal: Normal range of motion. He exhibits no edema.  Neurological: He is alert and oriented to person, place, and time.  Skin: Skin is warm and dry. No rash noted. He is not diaphoretic. No erythema. No pallor.  Psychiatric: He has a normal mood and affect.  Nursing note and vitals reviewed.    ED Treatments / Results  Labs (all labs ordered are listed, but only abnormal results are displayed) Labs Reviewed  RAPID STREP SCREEN ([facility] ONLY)  CULTURE, GROUP A STREP [facility])    EKG None  Radiology Dg Chest 2 View  Result Date: 10/26/2017 CLINICAL DATA:  Cough and congestion. EXAM: CHEST - 2 VIEW COMPARISON:  06/08/2016. FINDINGS: Mediastinum and hilar structures normal. Lungs are clear. No pleural effusion or pneumothorax. Heart size normal. Thoracic spine scoliosis. IMPRESSION: No acute cardiopulmonary disease. Electronically Signed   By: Maisie Fus  Register   On: 10/26/2017 10:20    Procedures Procedures (including critical care time)  Medications Ordered in ED Medications - No data to display   Initial Impression / Assessment and Plan / ED Course  I have reviewed the triage vital signs and the nursing notes.  Pertinent labs & imaging results that were available during my care of the patient were reviewed by me and considered in my medical decision making (see chart for details).    Patient presents with 4 days of sore throat, productive cough streaks of blood in his mucus.  He is well-appearing, nontoxic afebrile.  Chest X-rays negative Rapid strep negative  Will discharge  patient home with symptomatic relief and close follow-up with PCP.  Patient does not appear dehydrated and is tolerating oral secretions. Patient was advised to stay well-hydrated.  Return precautions discussed and patient understands and agrees with plan.  Final Clinical Impressions(s) / ED Diagnoses   Final diagnoses:  Viral URI with cough    ED Discharge Orders        Ordered    fluticasone (FLONASE) 50 MCG/ACT nasal spray  Daily     10/26/17 1139    sodium chloride (OCEAN) 0.65 % SOLN nasal spray  As needed     10/26/17 1139    cetirizine (ZYRTEC) 10 MG tablet  Daily     10/26/17 1139       Gregary Cromer 10/26/17 1149    Tilden Fossa, MD 10/27/17 [phone]

## 2017-10-26 NOTE — Discharge Instructions (Addendum)
As discussed, your chest x-ray was negative and your rapid strep was negative today.  You are likely experiencing symptoms from a viral upper respiratory infection or environmental allergies.  Use the nasal spray as instructed and antihistamine to help with congestion.  Follow up with your primary care provider. Return if symptoms worsen in the meantime.

## 2017-10-26 NOTE — ED Notes (Signed)
ED Provider at bedside. 

## 2017-10-26 NOTE — ED Notes (Signed)
Pt denies fevers, SOB, chest pain.

## 2017-10-28 LAB — CULTURE, GROUP A STREP (THRC)

## 2018-08-17 ENCOUNTER — Emergency Department (HOSPITAL_BASED_OUTPATIENT_CLINIC_OR_DEPARTMENT_OTHER): Payer: Self-pay

## 2018-08-17 ENCOUNTER — Emergency Department (HOSPITAL_BASED_OUTPATIENT_CLINIC_OR_DEPARTMENT_OTHER)
Admission: EM | Admit: 2018-08-17 | Discharge: 2018-08-17 | Disposition: A | Discharge status: Home / Self Care | Payer: Self-pay | Attending: Emergency Medicine | Admitting: Emergency Medicine

## 2018-08-17 ENCOUNTER — Other Ambulatory Visit: Payer: Self-pay

## 2018-08-17 ENCOUNTER — Encounter (HOSPITAL_BASED_OUTPATIENT_CLINIC_OR_DEPARTMENT_OTHER): Payer: Self-pay | Admitting: Emergency Medicine

## 2018-08-17 DIAGNOSIS — R070 Pain in throat: Secondary | ICD-10-CM | POA: Insufficient documentation

## 2018-08-17 DIAGNOSIS — F17200 Nicotine dependence, unspecified, uncomplicated: Secondary | ICD-10-CM | POA: Insufficient documentation

## 2018-08-17 DIAGNOSIS — R05 Cough: Secondary | ICD-10-CM | POA: Insufficient documentation

## 2018-08-17 DIAGNOSIS — Z79899 Other long term (current) drug therapy: Secondary | ICD-10-CM | POA: Insufficient documentation

## 2018-08-17 DIAGNOSIS — R059 Cough, unspecified: Secondary | ICD-10-CM

## 2018-08-17 MED ORDER — SALINE SPRAY 0.65 % NA SOLN: 1.0000 | NASAL | 0 refills | Status: AC | PRN

## 2018-08-17 MED ORDER — BENZONATATE 100 MG PO CAPS: 100.0000 mg | ORAL_CAPSULE | Freq: Three times a day (TID) | ORAL | 0 refills | Status: DC

## 2018-08-17 MED ORDER — ACETAMINOPHEN 325 MG PO TABS
650.0000 mg | ORAL_TABLET | Freq: Once | ORAL | Status: AC | PRN
  Administered 2018-08-17: 650 mg via ORAL
  Filled 2018-08-17: qty 2

## 2018-08-17 MED ORDER — AZITHROMYCIN 250 MG PO TABS: 250.0000 mg | ORAL_TABLET | Freq: Every day | ORAL | 0 refills | Status: AC

## 2018-08-17 MED ORDER — FLUTICASONE PROPIONATE 50 MCG/ACT NA SUSP: 1.0000 | Freq: Every day | NASAL | 0 refills | Status: AC

## 2018-08-17 NOTE — ED Provider Notes (Signed)
[facility] EMERGENCY DEPARTMENT Provider Note   CSN: [id number] Arrival date & time: 08/17/18  1326    History   Chief Complaint Chief Complaint  Patient presents with  . Cough    HPI Duane Banks is a 33 y.o. male who presents with a one-week history of cough and chills.  Patient has had some sore throat only with coughing.  His cough has been productive over the past couple days.  He denies any ear pain.  He has had some associated nasal congestion.  He denies any vomiting or diarrhea, chest pain, or shortness of breath.  He has been using cough drops at home.  Patient denies any sick contacts or recent travel.     HPI  Past Medical History:  Diagnosis Date  . Heart valve regurgitation   . Lymph node symptom     Patient Active Problem List   Diagnosis Date Noted  . Chest pain 01/07/2014  . SOB (shortness of breath) 01/07/2014  . Palpitation 01/07/2014  . Right hamstring muscle strain 09/11/2013  . OTHER DYSPNEA AND RESPIRATORY ABNORMALITIES 09/17/2009    Past Surgical History:  Procedure Laterality Date  . None          Home Medications    Prior to Admission medications   Medication Sig Start Date End Date Taking? Authorizing Provider  azithromycin (ZITHROMAX) 250 MG tablet Take 1 tablet (250 mg total) by mouth daily. Take first 2 tablets together, then 1 every day until finished. 08/17/18   Julliette Frentz, Waylan Boga, PA-C  benzonatate (TESSALON) 100 MG capsule Take 1 capsule (100 mg total) by mouth every 8 (eight) hours. 08/17/18   Jakavion Bilodeau, Waylan Boga, PA-C  cetirizine (ZYRTEC) 10 MG tablet Take 1 tablet (10 mg total) by mouth daily. 10/26/17   Mathews Robinsons B, PA-C  fluticasone (FLONASE) 50 MCG/ACT nasal spray Place 1 spray into both nostrils daily. 08/17/18   Leighton Luster, Waylan Boga, PA-C  sodium chloride (OCEAN) 0.65 % SOLN nasal spray Place 1 spray into both nostrils as needed for congestion. 08/17/18   Emi Holes, PA-C    Family History Family History   Problem Relation Age of Onset  . Sudden death Neg Hx   . Hypertension Neg Hx   . Hyperlipidemia Neg Hx   . Heart attack Neg Hx   . Diabetes Neg Hx     Social History Social History   Tobacco Use  . Smoking status: Current Every Day Smoker  . Smokeless tobacco: Never Used  Substance Use Topics  . Alcohol use: Yes    Comment: occ  . Drug use: No     Allergies   Patient has no known allergies.   Review of Systems Review of Systems  Constitutional: Positive for chills and fever.  HENT: Positive for congestion and sore throat (with coughing only). Negative for ear pain.   Respiratory: Positive for cough. Negative for shortness of breath.   Cardiovascular: Negative for chest pain.  Gastrointestinal: Negative for diarrhea, nausea and vomiting.     Physical Exam Updated Vital Signs BP (!) 128/56 (BP Location: Right Arm)   Pulse 88   Temp (!) 100.8 F (38.2 C) (Oral)   Resp 18   Ht 6\' 3"  (1.905 m)   Wt 95.3 kg   SpO2 100%   BMI 26.25 kg/m   Physical Exam Vitals signs and nursing note reviewed.  Constitutional:      General: He is not in acute distress.    Appearance: He  is well-developed. He is not diaphoretic.  HENT:     Head: Normocephalic and atraumatic.     Right Ear: Tympanic membrane normal.     Left Ear: Tympanic membrane normal.     Nose: Congestion present.     Mouth/Throat:     Pharynx: No oropharyngeal exudate or posterior oropharyngeal erythema.  Eyes:     General: No scleral icterus.       Right eye: No discharge.        Left eye: No discharge.     Conjunctiva/sclera: Conjunctivae normal.     Pupils: Pupils are equal, round, and reactive to light.  Neck:     Musculoskeletal: Normal range of motion and neck supple.     Thyroid: No thyromegaly.  Cardiovascular:     Rate and Rhythm: Normal rate and regular rhythm.     Heart sounds: Normal heart sounds. No murmur. No friction rub. No gallop.   Pulmonary:     Effort: Pulmonary effort is  normal. No respiratory distress.     Breath sounds: Normal breath sounds. No stridor. No wheezing or rales.  Abdominal:     General: Bowel sounds are normal. There is no distension.     Palpations: Abdomen is soft.     Tenderness: There is no abdominal tenderness. There is no guarding or rebound.  Lymphadenopathy:     Cervical: No cervical adenopathy.  Skin:    General: Skin is warm and dry.     Coloration: Skin is not pale.     Findings: No rash.  Neurological:     Mental Status: He is alert.     Coordination: Coordination normal.      ED Treatments / Results  Labs (all labs ordered are listed, but only abnormal results are displayed) Labs Reviewed - No data to display  EKG None  Radiology Dg Chest 2 View  Result Date: 08/17/2018 CLINICAL DATA:  Cough and fever EXAM: CHEST - 2 VIEW COMPARISON:  Oct 26, 2017 FINDINGS: The lungs are clear. The heart size and pulmonary vascularity are normal. No adenopathy. There is mild upper thoracic levoscoliosis. IMPRESSION: No edema or consolidation. Electronically Signed   By: Bretta Bang III M.D.   On: 08/17/2018 14:43    Procedures Procedures (including critical care time)  Medications Ordered in ED Medications  acetaminophen (TYLENOL) tablet 650 mg (650 mg Oral Given 08/17/18 1358)     Initial Impression / Assessment and Plan / ED Course  I have reviewed the triage vital signs and the nursing notes.  Pertinent labs & imaging results that were available during my care of the patient were reviewed by me and considered in my medical decision making (see chart for details).        Patient with a one-week history of cough and nasal congestion.  He has had chills.  He is febrile here, given Tylenol.  Chest x-ray is clear, however considering duration of illness in the setting of fever, will cover with azithromycin.  Will give Tessalon for cough, Flonase and nasal saline for nasal congestion.  Return precautions discussed.   Patient understands and agrees with plan.  Patient vital stable throughout ED course and discharged in satisfactory condition.  Final Clinical Impressions(s) / ED Diagnoses   Final diagnoses:  Cough    ED Discharge Orders         Ordered    azithromycin (ZITHROMAX) 250 MG tablet  Daily     08/17/18 1513    benzonatate (TESSALON)  100 MG capsule  Every 8 hours     08/17/18 1513    sodium chloride (OCEAN) 0.65 % SOLN nasal spray  As needed     08/17/18 1513    fluticasone (FLONASE) 50 MCG/ACT nasal spray  Daily     08/17/18 1513           Emi Holes, PA-C 08/17/18 1515    Cathren Laine, MD 08/17/18 1555

## 2018-08-17 NOTE — Discharge Instructions (Addendum)
Take azithromycin until completed.  Take Tessalon every 8 hours as needed for cough.  Use Flonase once daily for nasal congestion. Use nasal saline throughout the day as needed for nasal congestion.  I recommend alternating ibuprofen and acetaminophen as prescribed over-the-counter, as needed for fever.  Please return the emergency department you develop any new or worsening symptoms including shortness of breath, chest pain, or any other concerning symptoms.

## 2018-08-17 NOTE — ED Triage Notes (Signed)
Cough x 1 week with chills.

## 2020-09-29 ENCOUNTER — Emergency Department (HOSPITAL_BASED_OUTPATIENT_CLINIC_OR_DEPARTMENT_OTHER): Payer: Self-pay

## 2020-09-29 ENCOUNTER — Emergency Department (HOSPITAL_BASED_OUTPATIENT_CLINIC_OR_DEPARTMENT_OTHER)
Admission: EM | Admit: 2020-09-29 | Discharge: 2020-09-29 | Disposition: A | Discharge status: Home / Self Care | Payer: Self-pay | Attending: Emergency Medicine | Admitting: Emergency Medicine

## 2020-09-29 ENCOUNTER — Encounter (HOSPITAL_BASED_OUTPATIENT_CLINIC_OR_DEPARTMENT_OTHER): Payer: Self-pay

## 2020-09-29 ENCOUNTER — Other Ambulatory Visit: Payer: Self-pay

## 2020-09-29 DIAGNOSIS — Z87891 Personal history of nicotine dependence: Secondary | ICD-10-CM | POA: Insufficient documentation

## 2020-09-29 DIAGNOSIS — Z20822 Contact with and (suspected) exposure to covid-19: Secondary | ICD-10-CM | POA: Insufficient documentation

## 2020-09-29 DIAGNOSIS — R059 Cough, unspecified: Secondary | ICD-10-CM | POA: Insufficient documentation

## 2020-09-29 DIAGNOSIS — R0981 Nasal congestion: Secondary | ICD-10-CM | POA: Insufficient documentation

## 2020-09-29 MED ORDER — BENZONATATE 100 MG PO CAPS: 100.0000 mg | ORAL_CAPSULE | Freq: Three times a day (TID) | ORAL | 0 refills | Status: AC | PRN

## 2020-09-29 NOTE — ED Triage Notes (Signed)
Pt c/o flu like sx x 2 weeks-NAD-steady gait 

## 2020-09-29 NOTE — ED Provider Notes (Signed)
[facility] EMERGENCY DEPARTMENT Provider Note   CSN: [id number] Arrival date & time: 09/29/20  1318     History Chief Complaint  Patient presents with  . Cough    Duane Banks is a 35 y.o. male who presents with persistent nasal drainage and mildly productive cough x3 weeks.  Patient states that initially he experienced myalgias, fevers, chills, lack of appetite, body aches however this resolved after approximately 1 week.  He wanted to be evaluated as the nasal congestion and cough have persisted despite him feeling otherwise overall improved.  He is not vaccinated to COVID-19 or influenza. Denies chest pain, shortness of breath, nausea, vomiting, diarrhea.  I personally reviewed this patient's medical records.  He carries no medical diagnoses and is not on any medications every day.  He has been utilizing Mucinex and Tylenol as needed at home for symptoms.   HPI     Past Medical History:  Diagnosis Date  . Heart valve regurgitation   . Lymph node symptom     Patient Active Problem List   Diagnosis Date Noted  . Chest pain 01/07/2014  . SOB (shortness of breath) 01/07/2014  . Palpitation 01/07/2014  . Right hamstring muscle strain 09/11/2013  . OTHER DYSPNEA AND RESPIRATORY ABNORMALITIES 09/17/2009    Past Surgical History:  Procedure Laterality Date  . None         Family History  Problem Relation Age of Onset  . Sudden death Neg Hx   . Hypertension Neg Hx   . Hyperlipidemia Neg Hx   . Heart attack Neg Hx   . Diabetes Neg Hx     Social History   Tobacco Use  . Smoking status: Former Games developer  . Smokeless tobacco: Never Used  Vaping Use  . Vaping Use: Never used  Substance Use Topics  . Alcohol use: Yes    Comment: occ  . Drug use: No    Home Medications Prior to Admission medications   Medication Sig Start Date End Date Taking? Authorizing Provider  benzonatate (TESSALON) 100 MG capsule Take 1 capsule (100 mg total) by mouth 3  (three) times daily as needed for cough. 09/29/20  Yes Tabithia Stroder R, PA-C  azithromycin (ZITHROMAX) 250 MG tablet Take 1 tablet (250 mg total) by mouth daily. Take first 2 tablets together, then 1 every day until finished. 08/17/18   Law, Waylan Boga, PA-C  cetirizine (ZYRTEC) 10 MG tablet Take 1 tablet (10 mg total) by mouth daily. 10/26/17   Mathews Robinsons B, PA-C  fluticasone (FLONASE) 50 MCG/ACT nasal spray Place 1 spray into both nostrils daily. 08/17/18   Law, Waylan Boga, PA-C  sodium chloride (OCEAN) 0.65 % SOLN nasal spray Place 1 spray into both nostrils as needed for congestion. 08/17/18   Emi Holes, PA-C    Allergies    Patient has no known allergies.  Review of Systems   Review of Systems  Constitutional: Positive for chills. Negative for activity change, appetite change, diaphoresis, fatigue and fever.  HENT: Positive for congestion. Negative for sore throat, trouble swallowing and voice change.   Eyes: Negative.   Respiratory: Positive for cough. Negative for apnea, choking, chest tightness, shortness of breath, wheezing and stridor.   Cardiovascular: Negative.   Gastrointestinal: Negative.   Genitourinary: Negative.   Musculoskeletal: Negative.   Skin: Negative.   Allergic/Immunologic: Negative.   Neurological: Negative.   Hematological: Negative.     Physical Exam Updated Vital Signs BP 128/74 (BP Location: Left Arm)  Pulse (!) 58   Temp 98.5 F (36.9 C) (Oral)   Resp 18   Ht 6\' 3"  (1.905 m)   Wt 104.3 kg   SpO2 100%   BMI 28.75 kg/m   Physical Exam Vitals and nursing note reviewed.  Constitutional:      Appearance: Normal appearance. He is not ill-appearing or toxic-appearing.  HENT:     Head: Normocephalic and atraumatic.     Nose: Nose normal.     Mouth/Throat:     Mouth: Mucous membranes are moist.     Pharynx: Oropharynx is clear. Uvula midline. No oropharyngeal exudate, posterior oropharyngeal erythema or uvula swelling.      Tonsils: No tonsillar exudate or tonsillar abscesses.   Eyes:     General: Lids are normal. Vision grossly intact.        Right eye: No discharge.        Left eye: No discharge.     Extraocular Movements: Extraocular movements intact.     Conjunctiva/sclera: Conjunctivae normal.     Pupils: Pupils are equal, round, and reactive to light.  Neck:     Trachea: Trachea and phonation normal.  Cardiovascular:     Rate and Rhythm: Normal rate and regular rhythm.     Pulses: Normal pulses.     Heart sounds: Normal heart sounds. No murmur heard.   Pulmonary:     Effort: Pulmonary effort is normal. No tachypnea, bradypnea, accessory muscle usage, prolonged expiration or respiratory distress.     Breath sounds: Examination of the right-lower field reveals rales. Rales present. No decreased breath sounds or wheezing.  Chest:     Chest wall: No mass, lacerations, deformity, swelling, tenderness, crepitus or edema.  Abdominal:     General: Bowel sounds are normal. There is no distension.     Palpations: Abdomen is soft.     Tenderness: There is no abdominal tenderness. There is no right CVA tenderness, left CVA tenderness, guarding or rebound.  Musculoskeletal:        General: No deformity.     Cervical back: Normal range of motion and neck supple. No edema, rigidity or crepitus. No pain with movement, spinous process tenderness or muscular tenderness.     Right lower leg: No edema.     Left lower leg: No edema.  Lymphadenopathy:     Cervical: No cervical adenopathy.  Skin:    General: Skin is warm and dry.  Neurological:     General: No focal deficit present.     Mental Status: He is alert. Mental status is at baseline.     Sensory: Sensation is intact.     Motor: Motor function is intact.     Gait: Gait is intact.  Psychiatric:        Mood and Affect: Mood normal.     ED Results / Procedures / Treatments   Labs (all labs ordered are listed, but only abnormal results are  displayed) Labs Reviewed  SARS CORONAVIRUS 2 (TAT 6-24 HRS)    EKG None  Radiology DG Chest Portable 1 View  Result Date: 09/29/2020 CLINICAL DATA:  Rales EXAM: PORTABLE CHEST 1 VIEW COMPARISON:  August 17, 2018 FINDINGS: Lungs are clear. Heart size and pulmonary vascularity are normal. No adenopathy. No bone lesions. IMPRESSION: Lungs clear.  Cardiac silhouette normal. Electronically Signed   By: August 19, 2018 III M.D.   On: 09/29/2020 16:13    Procedures Procedures   Medications Ordered in ED Medications - No data to display  ED Course  I have reviewed the triage vital signs and the nursing notes.  Pertinent labs & imaging results that were available during my care of the patient were reviewed by me and considered in my medical decision making (see chart for details).    MDM Rules/Calculators/A&P                         35 year old male presents with concern for persistent nasal drainage and mildly productive cough x3 weeks.  Not vaccinating is COVID-19.  No shortness of breath or chest pain.  Differential diagnosis includes but is not limited to seasonal allergies, viral URI, COVID-19, reactive airway disease, pneumonia, pleural effusion, PE.  Vital signs are normal on intake.  Cardiopulmonary exam revealed rales in the right lung base but was otherwise unremarkable.  Abdominal exam is benign.  HEENT exam revealed mild posterior pharyngeal cobblestoning without erythema or exudate.  Patient is PERC negative and well-appearing.  We will proceed with chest x-ray given rales heard on physical exam, and COVID test by PCR.  Chest x-ray reassuring.  No further work-up warranted in the ED at this time given reassuring physical exam, vital signs, and chest x-ray.  Recommend patient begin taking seasonal allergy medication to improve any component of allergies in his current symptoms. Recommend PCP follow-up, patient previously established with [facility] family physicians.  Pritesh voiced  understanding of his medical evaluation and treatment plan.  Each of his questions was answered to his expressed satisfaction.  Return precautions were given.  Patient is well-appearing, stable, and appropriate for discharge at this time.  This chart was dictated using voice recognition software, Dragon. Despite the best efforts of this provider to proofread and correct errors, errors may still occur which can change documentation meaning.  Final Clinical Impression(s) / ED Diagnoses Final diagnoses:  Cough    Rx / DC Orders ED Discharge Orders         Ordered    benzonatate (TESSALON) 100 MG capsule  3 times daily PRN        09/29/20 1703           Chazz Philson, Eugene Gavia, PA-C 09/29/20 1706    Vanetta Mulders, MD 09/30/20 1516

## 2020-09-29 NOTE — Discharge Instructions (Addendum)
You are seen in the emergency department today for your persistent cough.  Your physical exam, vital signs, chest x-ray were reassuring.  You have a COVID-19 test pending which you may follow-up in your MyChart app.  If you do test positive, you do not need to quarantine as you have been symptomatic for greater than 10 days.  You have been prescribed a medication to suppress your cough at nighttime if you choose to use it.  I recommend you start taking a seasonal allergy medication such as Zyrtec (cetirizine) or Claritin (loratadine), or Flonase nasal spray for any component of seasonal allergies.  Please increase your hydration.  Please follow-up with your primary care doctor; recommend to call [facility] family physicians with whom you were seen in the past.  Return to the emergency department felt any chest pain, difficulty breathing, nausea or vomiting does not stop, or any other new severe symptoms.

## 2020-09-30 LAB — SARS CORONAVIRUS 2 (TAT 6-24 HRS): SARS Coronavirus 2: NEGATIVE

## 2021-04-15 ENCOUNTER — Encounter (HOSPITAL_BASED_OUTPATIENT_CLINIC_OR_DEPARTMENT_OTHER): Payer: Self-pay | Admitting: Emergency Medicine

## 2021-04-15 ENCOUNTER — Other Ambulatory Visit: Payer: Self-pay

## 2021-04-15 ENCOUNTER — Emergency Department (HOSPITAL_BASED_OUTPATIENT_CLINIC_OR_DEPARTMENT_OTHER)
Admission: EM | Admit: 2021-04-15 | Discharge: 2021-04-15 | Disposition: A | Discharge status: Home / Self Care | Payer: Self-pay | Attending: Emergency Medicine | Admitting: Emergency Medicine

## 2021-04-15 DIAGNOSIS — Z87891 Personal history of nicotine dependence: Secondary | ICD-10-CM | POA: Insufficient documentation

## 2021-04-15 DIAGNOSIS — W25XXXA Contact with sharp glass, initial encounter: Secondary | ICD-10-CM | POA: Insufficient documentation

## 2021-04-15 DIAGNOSIS — Z23 Encounter for immunization: Secondary | ICD-10-CM | POA: Insufficient documentation

## 2021-04-15 DIAGNOSIS — S41111A Laceration without foreign body of right upper arm, initial encounter: Secondary | ICD-10-CM | POA: Insufficient documentation

## 2021-04-15 MED ORDER — TETANUS-DIPHTH-ACELL PERTUSSIS 5-2.5-18.5 LF-MCG/0.5 IM SUSY
0.5000 mL | PREFILLED_SYRINGE | Freq: Once | INTRAMUSCULAR | Status: AC
  Administered 2021-04-15: 0.5 mL via INTRAMUSCULAR
  Filled 2021-04-15: qty 0.5

## 2021-04-15 MED ORDER — LIDOCAINE-EPINEPHRINE 2 %-1:200000 IJ SOLN
20.0000 mL | Freq: Once | INTRAMUSCULAR | Status: AC
Start: 2021-04-15 — End: 2021-04-15

## 2021-04-15 MED ORDER — LIDOCAINE-EPINEPHRINE (PF) 2 %-1:200000 IJ SOLN
INTRAMUSCULAR | Status: AC
  Administered 2021-04-15: 20 mL
  Filled 2021-04-15: qty 20

## 2021-04-15 NOTE — ED Triage Notes (Signed)
Pt states moving cabinets with glass and slipped and cut right forearm pt arrived with pressure bandage.

## 2021-04-15 NOTE — ED Provider Notes (Signed)
[facility] EMERGENCY DEPARTMENT Provider Note   CSN: [id number] Arrival date & time: 04/15/21  2211     History Chief Complaint  Patient presents with   Extremity Laceration    Duane Banks is a 35 y.o. male.  35 yo M chief complaints of a arm laceration.  The patient states that Duane Banks was moving furniture and a cabinet with glass broke onto his arm.  That happened a couple hours ago.  Duane Banks tried to wrap it but realized that it was continuing to bleed and so came here for evaluation.  Duane Banks also suffered 2 lacerations to his hand which Duane Banks thinks are fine.  Duane Banks is unsure of his last tetanus.  The history is provided by the patient.  Illness Severity:  Moderate Onset quality:  Gradual Duration:  2 days Timing:  Constant Progression:  Worsening Associated symptoms: no abdominal pain, no chest pain, no congestion, no diarrhea, no fever, no headaches, no myalgias, no rash, no shortness of breath and no vomiting       Past Medical History:  Diagnosis Date   Heart valve regurgitation    Lymph node symptom     Patient Active Problem List   Diagnosis Date Noted   Chest pain 01/07/2014   SOB (shortness of breath) 01/07/2014   Palpitation 01/07/2014   Right hamstring muscle strain 09/11/2013   OTHER DYSPNEA AND RESPIRATORY ABNORMALITIES 09/17/2009    Past Surgical History:  Procedure Laterality Date   None         Family History  Problem Relation Age of Onset   Sudden death Neg Hx    Hypertension Neg Hx    Hyperlipidemia Neg Hx    Heart attack Neg Hx    Diabetes Neg Hx     Social History   Tobacco Use   Smoking status: Former   Smokeless tobacco: Never  Building services engineer Use: Never used  Substance Use Topics   Alcohol use: Yes    Comment: occ   Drug use: No    Home Medications Prior to Admission medications   Medication Sig Start Date End Date Taking? Authorizing Provider  azithromycin (ZITHROMAX) 250 MG tablet Take 1 tablet (250 mg total) by  mouth daily. Take first 2 tablets together, then 1 every day until finished. 08/17/18   Law, Waylan Boga, PA-C  benzonatate (TESSALON) 100 MG capsule Take 1 capsule (100 mg total) by mouth 3 (three) times daily as needed for cough. 09/29/20   Sponseller, Eugene Gavia, PA-C  cetirizine (ZYRTEC) 10 MG tablet Take 1 tablet (10 mg total) by mouth daily. 10/26/17   Mathews Robinsons B, PA-C  fluticasone (FLONASE) 50 MCG/ACT nasal spray Place 1 spray into both nostrils daily. 08/17/18   Law, Waylan Boga, PA-C  sodium chloride (OCEAN) 0.65 % SOLN nasal spray Place 1 spray into both nostrils as needed for congestion. 08/17/18   Emi Holes, PA-C    Allergies    Patient has no known allergies.  Review of Systems   Review of Systems  Constitutional:  Negative for chills and fever.  HENT:  Negative for congestion and facial swelling.   Eyes:  Negative for discharge and visual disturbance.  Respiratory:  Negative for shortness of breath.   Cardiovascular:  Negative for chest pain and palpitations.  Gastrointestinal:  Negative for abdominal pain, diarrhea and vomiting.  Musculoskeletal:  Negative for arthralgias and myalgias.  Skin:  Positive for wound. Negative for color change and rash.  Neurological:  Negative for tremors, syncope and headaches.  Psychiatric/Behavioral:  Negative for confusion and dysphoric mood.    Physical Exam Updated Vital Signs BP (!) 153/65 (BP Location: Left Arm)   Pulse 98   Temp 98.4 F (36.9 C) (Oral)   Resp 20   Ht 6\' 3"  (1.905 m)   Wt 104.3 kg   SpO2 99%   BMI 28.75 kg/m   Physical Exam Vitals and nursing note reviewed.  Constitutional:      Appearance: Duane Banks is well-developed.  HENT:     Head: Normocephalic and atraumatic.  Eyes:     Pupils: Pupils are equal, round, and reactive to light.  Neck:     Vascular: No JVD.  Cardiovascular:     Rate and Rhythm: Normal rate and regular rhythm.     Heart sounds: No murmur heard.   No friction rub. No gallop.   Pulmonary:     Effort: No respiratory distress.     Breath sounds: No wheezing.  Abdominal:     General: There is no distension.     Tenderness: There is no abdominal tenderness. There is no guarding or rebound.  Musculoskeletal:        General: Normal range of motion.     Cervical back: Normal range of motion and neck supple.     Comments: Linear laceration to the right dorsal forarm.  Slightly gaping, bleeding controlled. PMS intact distally.  Lac to the R third MTP, and first MTP.  Full ROM, no crepitus.   Skin:    Coloration: Skin is not pale.     Findings: No rash.  Neurological:     Mental Status: Duane Banks is alert and oriented to person, place, and time.  Psychiatric:        Behavior: Behavior normal.    ED Results / Procedures / Treatments   Labs (all labs ordered are listed, but only abnormal results are displayed) Labs Reviewed - No data to display  EKG None  Radiology No results found.  Procedures . Laceration Repair  Date/Time: 04/15/2021 10:59 PM Performed by: 13/04/2021, DO Authorized by: Melene Plan, DO   Consent:    Consent obtained:  Verbal   Consent given by:  Patient   Risks, benefits, and alternatives were discussed: yes     Risks discussed:  Infection, pain, poor cosmetic result and poor wound healing   Alternatives discussed:  No treatment and delayed treatment Universal protocol:    Procedure explained and questions answered to patient or proxy's satisfaction: yes     Imaging studies available: no     Immediately prior to procedure, a time out was called: yes     Patient identity confirmed:  Verbally with patient Anesthesia:    Anesthesia method:  Local infiltration   Local anesthetic:  Lidocaine 2% WITH epi Laceration details:    Location:  Shoulder/arm   Shoulder/arm location:  R lower arm   Length (cm):  2.8 Pre-procedure details:    Preparation:  Patient was prepped and draped in usual sterile fashion Exploration:    Limited defect  created (wound extended): no     Hemostasis achieved with:  Epinephrine and direct pressure   Wound exploration: entire depth of wound visualized     Wound extent: no nerve damage noted, no tendon damage noted and no vascular damage noted     Contaminated: no   Treatment:    Area cleansed with:  Saline   Amount of cleaning:  Standard   Irrigation solution:  Sterile saline   Irrigation volume:  250   Irrigation method:  Pressure wash   Visualized foreign bodies/material removed: no     Debridement:  None   Undermining:  None   Scar revision: no   Skin repair:    Repair method:  Sutures   Suture size:  3-0   Suture material:  Nylon   Suture technique:  Simple interrupted   Number of sutures:  7 Approximation:    Approximation:  Close Repair type:    Repair type:  Simple Post-procedure details:    Dressing:  Open (no dressing)   Procedure completion:  Tolerated well, no immediate complications   Medications Ordered in ED Medications  Tdap (BOOSTRIX) injection 0.5 mL (has no administration in time range)  lidocaine-EPINEPHrine (XYLOCAINE W/EPI) 2 %-1:200000 (PF) injection 20 mL (20 mLs Infiltration Given by Other 04/15/21 2227)    ED Course  I have reviewed the triage vital signs and the nursing notes.  Pertinent labs & imaging results that were available during my care of the patient were reviewed by me and considered in my medical decision making (see chart for details).    MDM Rules/Calculators/A&P                           35 yo M with a cc of R forearm lac.  Sutured at bedside, Tdap updated.  Two other lacs to the hand, Duane Banks is not requesting care for them.  11:01 PM:  I have discussed the diagnosis/risks/treatment options with the patient and believe the pt to be eligible for discharge home to follow-up with PCP. We also discussed returning to the ED immediately if new or worsening sx occur. We discussed the sx which are most concerning (e.g., sudden worsening pain,  fever, inability to tolerate by mouth) that necessitate immediate return. Medications administered to the patient during their visit and any new prescriptions provided to the patient are listed below.  Medications given during this visit Medications  Tdap (BOOSTRIX) injection 0.5 mL (has no administration in time range)  lidocaine-EPINEPHrine (XYLOCAINE W/EPI) 2 %-1:200000 (PF) injection 20 mL (20 mLs Infiltration Given by Other 04/15/21 2227)     The patient appears reasonably screen and/or stabilized for discharge and I doubt any other medical condition or other [facility] requiring further screening, evaluation, or treatment in the ED at this time prior to discharge.   Final Clinical Impression(s) / ED Diagnoses Final diagnoses:  Laceration of right upper extremity, initial encounter    Rx / DC Orders ED Discharge Orders     None        Melene Plan, DO 04/15/21 2301

## 2021-04-15 NOTE — ED Notes (Signed)
ED Provider at bedside. 

## 2021-04-15 NOTE — Discharge Instructions (Signed)
Return for redness drainage or if you get a fever.  Sutures that were placed should be removed sometime between day 7 and 10.  This can be done here at urgent care or at your family doctor's office.   The area can get wet but not fully immersed underwater.  No scrubbing.  If you really want to clean it you can apply a half-and-half hydrogen peroxide solution with water on a Q-tip.  You can apply an ointment a couple times a day this could be as simple as Vaseline but could also be an antibiotic ointment if you wish..  Once it is healed please try to avoid prolonged sun exposure use sunscreen.

## 2021-04-16 ENCOUNTER — Encounter (HOSPITAL_BASED_OUTPATIENT_CLINIC_OR_DEPARTMENT_OTHER): Payer: Self-pay | Admitting: *Deleted

## 2021-04-16 ENCOUNTER — Emergency Department (HOSPITAL_BASED_OUTPATIENT_CLINIC_OR_DEPARTMENT_OTHER)
Admission: EM | Admit: 2021-04-16 | Discharge: 2021-04-16 | Disposition: A | Discharge status: Home / Self Care | Payer: Self-pay | Attending: Emergency Medicine | Admitting: Emergency Medicine

## 2021-04-16 DIAGNOSIS — W25XXXD Contact with sharp glass, subsequent encounter: Secondary | ICD-10-CM | POA: Insufficient documentation

## 2021-04-16 DIAGNOSIS — Z5189 Encounter for other specified aftercare: Secondary | ICD-10-CM

## 2021-04-16 DIAGNOSIS — S51811D Laceration without foreign body of right forearm, subsequent encounter: Secondary | ICD-10-CM | POA: Insufficient documentation

## 2021-04-16 DIAGNOSIS — Z87891 Personal history of nicotine dependence: Secondary | ICD-10-CM | POA: Insufficient documentation

## 2021-04-16 DIAGNOSIS — Z4801 Encounter for change or removal of surgical wound dressing: Secondary | ICD-10-CM | POA: Insufficient documentation

## 2021-04-16 NOTE — Discharge Instructions (Signed)
Keep the pressure dressing on overnight and remove in the morning.  If you develop any numbness or tingling in your hand or arm, please loosen the dressing.  Please follow wound care instructions provided to you yesterday and have the sutures removed in 7 to 10 days.

## 2021-04-16 NOTE — ED Provider Notes (Signed)
[facility] EMERGENCY DEPARTMENT Provider Note   CSN: [id number] Arrival date & time: 04/16/21  1701     History Chief Complaint  Patient presents with   Wound Check    Duane Banks is a 35 y.o. male.  Patient presents the emergency department for recheck of right forearm laceration.  Patient was seen here in the emergency department and received 7 stitches last evening after being cut by a piece of glass.  He states he was resting today because he felt like he was getting a cold.  He then noticed that the bleeding in the right forearm started again and he bled through a bandage.  He attempted to hold pressure and replace the bandages, without improvement in symptoms.  He came to the emergency department for evaluation and treatment.  He denies new injuries, anticoagulation or bleeding disorder.  No numbness or tingling in the arm.      Past Medical History:  Diagnosis Date   Heart valve regurgitation    Lymph node symptom     Patient Active Problem List   Diagnosis Date Noted   Chest pain 01/07/2014   SOB (shortness of breath) 01/07/2014   Palpitation 01/07/2014   Right hamstring muscle strain 09/11/2013   OTHER DYSPNEA AND RESPIRATORY ABNORMALITIES 09/17/2009    Past Surgical History:  Procedure Laterality Date   None         Family History  Problem Relation Age of Onset   Sudden death Neg Hx    Hypertension Neg Hx    Hyperlipidemia Neg Hx    Heart attack Neg Hx    Diabetes Neg Hx     Social History   Tobacco Use   Smoking status: Former   Smokeless tobacco: Never  Building services engineer Use: Never used  Substance Use Topics   Alcohol use: Yes    Comment: occ   Drug use: No    Home Medications Prior to Admission medications   Medication Sig Start Date End Date Taking? Authorizing Provider  azithromycin (ZITHROMAX) 250 MG tablet Take 1 tablet (250 mg total) by mouth daily. Take first 2 tablets together, then 1 every day until finished.  08/17/18   Law, Waylan Boga, PA-C  benzonatate (TESSALON) 100 MG capsule Take 1 capsule (100 mg total) by mouth 3 (three) times daily as needed for cough. 09/29/20   Sponseller, Eugene Gavia, PA-C  cetirizine (ZYRTEC) 10 MG tablet Take 1 tablet (10 mg total) by mouth daily. 10/26/17   Mathews Robinsons B, PA-C  fluticasone (FLONASE) 50 MCG/ACT nasal spray Place 1 spray into both nostrils daily. 08/17/18   Law, Waylan Boga, PA-C  sodium chloride (OCEAN) 0.65 % SOLN nasal spray Place 1 spray into both nostrils as needed for congestion. 08/17/18   Emi Holes, PA-C    Allergies    Patient has no known allergies.  Review of Systems   Review of Systems  Musculoskeletal:  Negative for myalgias.  Skin:  Positive for wound.  Neurological:  Negative for numbness.   Physical Exam Updated Vital Signs BP 131/60 (BP Location: Left Arm)   Pulse 89   Temp 99.5 F (37.5 C) (Oral)   Resp 18   Ht 6\' 3"  (1.905 m)   Wt 104 kg   SpO2 97%   BMI 28.66 kg/m   Physical Exam Vitals and nursing note reviewed.  Constitutional:      Appearance: He is well-developed.  HENT:     Head: Normocephalic and  atraumatic.  Eyes:     Conjunctiva/sclera: Conjunctivae normal.  Cardiovascular:     Comments: 2+ radial pulse on the right, capillary refill less than 2 seconds. Pulmonary:     Effort: No respiratory distress.  Musculoskeletal:     Cervical back: Normal range of motion and neck supple.     Comments: Patient with right forearm laceration, sutures intact.  There is a bit of dark red blood oozing from the wound.  Skin:    General: Skin is warm and dry.  Neurological:     Mental Status: He is alert.    ED Results / Procedures / Treatments   Labs (all labs ordered are listed, but only abnormal results are displayed) Labs Reviewed - No data to display  EKG None  Radiology No results found.  Procedures Procedures   Medications Ordered in ED Medications - No data to display  ED Course  I  have reviewed the triage vital signs and the nursing notes.  Pertinent labs & imaging results that were available during my care of the patient were reviewed by me and considered in my medical decision making (see chart for details).  Patient seen and examined.  Initial bandage removed by myself, this was done replaced by a pressure bandage after examination.  Will reassess.   Vital signs reviewed and are as follows: BP 131/60 (BP Location: Left Arm)   Pulse 89   Temp 99.5 F (37.5 C) (Oral)   Resp 18   Ht 6\' 3"  (1.905 m)   Wt 104 kg   SpO2 97%   BMI 28.66 kg/m   6:19 PM pressure dressing reassessed.  Patient continues to have normal distal sensation and capillary refill.  2+ radial pulse.  No evidence of bleedthrough.  We will discharge home at this time.  Patient counseled to leave the dressing on overnight and remove in the morning.  We discussed loosening the dressing if he develops any numbness or tingling in the hand.     MDM Rules/Calculators/A&P                           Patient here for wound recheck after bleeding started today, controlled in ED with pressure.  Low concern for significant blood loss.  Forearm and hand neurovascularly intact during ED stay, before and after application of pressure dressing.   Final Clinical Impression(s) / ED Diagnoses Final diagnoses:  Visit for wound check    Rx / DC Orders ED Discharge Orders     None        , Renne Crigler 04/16/21 1820    13/12/22, MD 04/16/21 1925

## 2021-04-16 NOTE — ED Triage Notes (Signed)
Pt seen here last night for laceration to right forearm (repaired with sutures). States he was resting in bed today and the wound began bleeding again

## 2021-05-13 ENCOUNTER — Emergency Department (HOSPITAL_BASED_OUTPATIENT_CLINIC_OR_DEPARTMENT_OTHER)
Admission: EM | Admit: 2021-05-13 | Discharge: 2021-05-13 | Disposition: A | Discharge status: Home / Self Care | Payer: Self-pay | Attending: Student | Admitting: Student

## 2021-05-13 ENCOUNTER — Other Ambulatory Visit: Payer: Self-pay

## 2021-05-13 ENCOUNTER — Encounter (HOSPITAL_BASED_OUTPATIENT_CLINIC_OR_DEPARTMENT_OTHER): Payer: Self-pay

## 2021-05-13 DIAGNOSIS — S51811D Laceration without foreign body of right forearm, subsequent encounter: Secondary | ICD-10-CM | POA: Insufficient documentation

## 2021-05-13 DIAGNOSIS — Z87891 Personal history of nicotine dependence: Secondary | ICD-10-CM | POA: Insufficient documentation

## 2021-05-13 DIAGNOSIS — X58XXXD Exposure to other specified factors, subsequent encounter: Secondary | ICD-10-CM | POA: Insufficient documentation

## 2021-05-13 DIAGNOSIS — Z4802 Encounter for removal of sutures: Secondary | ICD-10-CM | POA: Insufficient documentation

## 2021-05-13 NOTE — ED Provider Notes (Signed)
[facility] EMERGENCY DEPARTMENT Provider Note   CSN: [id number] Arrival date & time: 05/13/21  1751     History Chief Complaint  Patient presents with   Suture / Staple Removal    Duane Banks is a 35 y.o. male who presents after laceration to the right forearm on 04/15/2021 for suture removal.  Patient reports that pain is fully improved, there has been no interval bleeding, no evidence of redness, swelling, or pus draining from the area.  Patient has not had to take anything for pain, and reports that the sutures have been retained without any difficulty.   Suture / Staple Removal      Past Medical History:  Diagnosis Date   Heart valve regurgitation    Lymph node symptom     Patient Active Problem List   Diagnosis Date Noted   Chest pain 01/07/2014   SOB (shortness of breath) 01/07/2014   Palpitation 01/07/2014   Right hamstring muscle strain 09/11/2013   OTHER DYSPNEA AND RESPIRATORY ABNORMALITIES 09/17/2009    Past Surgical History:  Procedure Laterality Date   None         Family History  Problem Relation Age of Onset   Sudden death Neg Hx    Hypertension Neg Hx    Hyperlipidemia Neg Hx    Heart attack Neg Hx    Diabetes Neg Hx     Social History   Tobacco Use   Smoking status: Former   Smokeless tobacco: Never  Building services engineer Use: Never used  Substance Use Topics   Alcohol use: Yes    Comment: occ   Drug use: No    Home Medications Prior to Admission medications   Medication Sig Start Date End Date Taking? Authorizing Provider  azithromycin (ZITHROMAX) 250 MG tablet Take 1 tablet (250 mg total) by mouth daily. Take first 2 tablets together, then 1 every day until finished. 08/17/18   Law, Waylan Boga, PA-C  benzonatate (TESSALON) 100 MG capsule Take 1 capsule (100 mg total) by mouth 3 (three) times daily as needed for cough. 09/29/20   Sponseller, Eugene Gavia, PA-C  cetirizine (ZYRTEC) 10 MG tablet Take 1 tablet (10 mg  total) by mouth daily. 10/26/17   Mathews Robinsons B, PA-C  fluticasone (FLONASE) 50 MCG/ACT nasal spray Place 1 spray into both nostrils daily. 08/17/18   Law, Waylan Boga, PA-C  sodium chloride (OCEAN) 0.65 % SOLN nasal spray Place 1 spray into both nostrils as needed for congestion. 08/17/18   Emi Holes, PA-C    Allergies    Patient has no known allergies.  Review of Systems   Review of Systems  Skin:  Positive for wound.  All other systems reviewed and are negative.  Physical Exam Updated Vital Signs BP 125/70 (BP Location: Left Arm)   Pulse 71   Temp 98.8 F (37.1 C) (Oral)   Resp 14   Ht 6\' 3"  (1.905 m)   Wt 107.5 kg   SpO2 96%   BMI 29.62 kg/m   Physical Exam Vitals and nursing note reviewed.  Constitutional:      General: He is not in acute distress.    Appearance: Normal appearance.  HENT:     Head: Normocephalic and atraumatic.  Eyes:     General:        Right eye: No discharge.        Left eye: No discharge.  Cardiovascular:     Rate and Rhythm: Normal rate  and regular rhythm.  Pulmonary:     Effort: Pulmonary effort is normal. No respiratory distress.  Musculoskeletal:        General: No deformity.  Skin:    General: Skin is warm and dry.     Comments: Well-healed, appropriately scarred wound on the right forearm that agrees with the laceration repair note from 04/15/2021.  The wound is well-healed, nonbleeding, dry, with some scabbing.  Neurological:     Mental Status: He is alert and oriented to person, place, and time.  Psychiatric:        Mood and Affect: Mood normal.        Behavior: Behavior normal.    ED Results / Procedures / Treatments   Labs (all labs ordered are listed, but only abnormal results are displayed) Labs Reviewed - No data to display  EKG None  Radiology No results found.  Procedures .Suture Removal  Date/Time: 05/13/2021 11:02 AM Performed by: Olene Floss, PA-C Authorized by: Olene Floss,  PA-C   Consent:    Consent obtained:  Verbal   Consent given by:  Patient   Risks, benefits, and alternatives were discussed: yes     Risks discussed:  Bleeding and pain   Alternatives discussed:  No treatment Universal protocol:    Procedure explained and questions answered to patient or proxy's satisfaction: yes     Patient identity confirmed:  Verbally with patient Location:    Location:  Upper extremity   Upper extremity location:  Arm   Arm location:  R lower arm Procedure details:    Wound appearance:  No signs of infection, clean and good wound healing   Number of sutures removed:  7   Number of staples removed:  0 Post-procedure details:    Post-removal:  No dressing applied   Procedure completion:  Tolerated   Medications Ordered in ED Medications - No data to display  ED Course  I have reviewed the triage vital signs and the nursing notes.  Pertinent labs & imaging results that were available during my care of the patient were reviewed by me and considered in my medical decision making (see chart for details).    MDM Rules/Calculators/A&P                         Sutures removed as described above.  No signs of infection.  Wound is healing well, expected healing changes.  Patient instructed to continue to monitor for signs of infection discharged in stable condition. Final Clinical Impression(s) / ED Diagnoses Final diagnoses:  Visit for suture removal    Rx / DC Orders ED Discharge Orders     None        [place] 05/13/21 1103    [place], [place], MD 05/13/21 1549

## 2021-05-13 NOTE — ED Triage Notes (Signed)
Pt arrives ambulatory to ED to have stitches removed from right forearm. Denies any redness, swelling, or leaking of fluids from the area.

## 2021-05-13 NOTE — Discharge Instructions (Signed)
As we discussed I recommend that you put some moisturizing lotion, or Vaseline over the area where the sutures were, and begin massaging your scar gently several times a day to help break up the scar tissue and smooth out the skin.  We will continue to monitor for signs of infection including increasing redness, pain, swelling, or pus draining from the area.  Please return to the emergency department if you have any questions or concerns.

## 2021-06-10 ENCOUNTER — Emergency Department (HOSPITAL_BASED_OUTPATIENT_CLINIC_OR_DEPARTMENT_OTHER)
Admission: EM | Admit: 2021-06-10 | Discharge: 2021-06-10 | Disposition: A | Discharge status: Home / Self Care | Payer: Self-pay | Attending: Emergency Medicine | Admitting: Emergency Medicine

## 2021-06-10 ENCOUNTER — Other Ambulatory Visit (HOSPITAL_BASED_OUTPATIENT_CLINIC_OR_DEPARTMENT_OTHER): Payer: Self-pay

## 2021-06-10 ENCOUNTER — Encounter (HOSPITAL_BASED_OUTPATIENT_CLINIC_OR_DEPARTMENT_OTHER): Payer: Self-pay

## 2021-06-10 ENCOUNTER — Other Ambulatory Visit: Payer: Self-pay

## 2021-06-10 DIAGNOSIS — Z202 Contact with and (suspected) exposure to infections with a predominantly sexual mode of transmission: Secondary | ICD-10-CM | POA: Insufficient documentation

## 2021-06-10 LAB — URINALYSIS, MICROSCOPIC (REFLEX)

## 2021-06-10 LAB — URINALYSIS, ROUTINE W REFLEX MICROSCOPIC
Bilirubin Urine: NEGATIVE
Glucose, UA: NEGATIVE mg/dL
Hgb urine dipstick: NEGATIVE
Ketones, ur: NEGATIVE mg/dL
Nitrite: NEGATIVE
Protein, ur: NEGATIVE mg/dL
Specific Gravity, Urine: 1.02 (ref 1.005–1.030)
pH: 5.5 (ref 5.0–8.0)

## 2021-06-10 MED ORDER — DOXYCYCLINE HYCLATE 100 MG PO CAPS
100.0000 mg | ORAL_CAPSULE | Freq: Two times a day (BID) | ORAL | 0 refills | Status: AC
  Filled 2021-06-10: qty 14, 7d supply, fill #0

## 2021-06-10 MED ORDER — CEFTRIAXONE SODIUM 500 MG IJ SOLR
500.0000 mg | Freq: Once | INTRAMUSCULAR | Status: AC
  Administered 2021-06-10: 500 mg via INTRAMUSCULAR
  Filled 2021-06-10: qty 500

## 2021-06-10 NOTE — Discharge Instructions (Addendum)
You were treated today for gonorrhea and chlamydia due to your symptoms and exposure. Ensure to complete the entire course of antibiotics prescribed. You may follow up with your primary care provider or [facility] if you are experiencing continued symptoms. Ensure to practice safe sex with condom use. Return to the Emergency Department if you are experiencing fever, increasing/worsening abdominal pain, penile pain/swelling, or worsening symptoms.

## 2021-06-10 NOTE — ED Triage Notes (Signed)
Pt arrives with reports of having relations with someone who has been in contact with chlamydia. States that he has some discomfort with urination X2 weeks, denies any discharge.

## 2021-06-10 NOTE — ED Provider Notes (Signed)
[place] EMERGENCY DEPARTMENT Provider Note   CSN: [id number] Arrival date & time: 06/10/21  [id number]     History  Chief Complaint  Patient presents with   SEXUALLY TRANSMITTED DISEASE    Duane PINAULT is a 36 y.o. male with no significant past medical history who presents to the Emergency Department complaining of exposure to STD onset today.  Patient reports he was notified that one of his sexual partners was diagnosed with chlamydia.  Pt has had 2 male sexual partners in the past 6 months. He has associated dysuria x2 weeks. Pt not tried any medications for his symptoms. Denies penile pain/swelling, testicular pain/swelling, abdominal pain, n/v, fever, chills. Denies allergies to medications.  Prior history of STI that was treated several years ago.      Past Medical History:  Diagnosis Date   Heart valve regurgitation    Lymph node symptom      The history is provided by the patient. No language interpreter was used.      Home Medications Prior to Admission medications   Medication Sig Start Date End Date Taking? Authorizing Provider  doxycycline (VIBRAMYCIN) 100 MG capsule Take 1 capsule (100 mg total) by mouth 2 (two) times daily for 7 days. 06/10/21 06/17/21 Yes Lekisha Mcghee A, PA-C  azithromycin (ZITHROMAX) 250 MG tablet Take 1 tablet (250 mg total) by mouth daily. Take first 2 tablets together, then 1 every day until finished. 08/17/18   Law, Bea Graff, PA-C  benzonatate (TESSALON) 100 MG capsule Take 1 capsule (100 mg total) by mouth 3 (three) times daily as needed for cough. 09/29/20   Sponseller, Gypsy Balsam, PA-C  cetirizine (ZYRTEC) 10 MG tablet Take 1 tablet (10 mg total) by mouth daily. 10/26/17   Avie Echevaria B, PA-C  fluticasone (FLONASE) 50 MCG/ACT nasal spray Place 1 spray into both nostrils daily. 08/17/18   Law, Bea Graff, PA-C  sodium chloride (OCEAN) 0.65 % SOLN nasal spray Place 1 spray into both nostrils as needed for congestion. 08/17/18    Frederica Kuster, PA-C      Allergies    Patient has no known allergies.    Review of Systems   Review of Systems  Constitutional:  Negative for chills and fever.  Gastrointestinal:  Negative for abdominal pain, nausea and vomiting.  Genitourinary:  Positive for dysuria. Negative for genital sores, penile discharge, penile pain, penile swelling, scrotal swelling and testicular pain.  Skin:  Negative for rash.  All other systems reviewed and are negative.  Physical Exam Updated Vital Signs BP (!) 141/59 (BP Location: Left Arm)    Pulse 80    Temp 99 F (37.2 C) (Oral)    Resp 16    Ht 6\' 3"  (1.905 m)    Wt 104.3 kg    SpO2 100%    BMI 28.75 kg/m  Physical Exam Vitals and nursing note reviewed. Exam conducted with a chaperone present.  Constitutional:      General: He is not in acute distress.    Appearance: He is not diaphoretic.  HENT:     Head: Normocephalic and atraumatic.     Mouth/Throat:     Pharynx: No oropharyngeal exudate.  Eyes:     General: No scleral icterus.    Conjunctiva/sclera: Conjunctivae normal.  Cardiovascular:     Rate and Rhythm: Normal rate and regular rhythm.     Pulses: Normal pulses.     Heart sounds: Normal heart sounds.  Pulmonary:  Effort: Pulmonary effort is normal. No respiratory distress.     Breath sounds: Normal breath sounds. No wheezing.  Abdominal:     General: Bowel sounds are normal.     Palpations: Abdomen is soft. There is no mass.     Tenderness: There is no abdominal tenderness. There is no guarding or rebound.     Hernia: There is no hernia in the left inguinal area or right inguinal area.  Genitourinary:    Pubic Area: No rash.      Penis: Normal and circumcised. No erythema, tenderness, discharge, swelling or lesions.      Testes: Normal.        Right: Mass, tenderness or swelling not present.        Left: Mass, tenderness or swelling not present.     Epididymis:     Right: Normal.     Left: Normal.     Comments: NT  chaperone present for exam.  No penile tenderness to palpation.  No discharge noted from penis.  No overlying skin changes, erythema, lesions.  No mass, tenderness, swelling noted to bilateral testes.  No concern for tenderness to palpation to bilateral epididymis.  Mild inguinal adenopathy noted bilaterally. Musculoskeletal:        General: Normal range of motion.     Cervical back: Normal range of motion and neck supple.  Lymphadenopathy:     Lower Body: Right inguinal adenopathy present. Left inguinal adenopathy present.  Skin:    General: Skin is warm and dry.  Neurological:     Mental Status: He is alert.  Psychiatric:        Behavior: Behavior normal.    ED Results / Procedures / Treatments   Labs (all labs ordered are listed, but only abnormal results are displayed) Labs Reviewed  URINALYSIS, ROUTINE W REFLEX MICROSCOPIC - Abnormal; Notable for the following components:      Result Value   Leukocytes,Ua TRACE (*)    All other components within normal limits  URINALYSIS, MICROSCOPIC (REFLEX) - Abnormal; Notable for the following components:   Bacteria, UA FEW (*)    All other components within normal limits  GC/CHLAMYDIA PROBE AMP () NOT AT [facility]    EKG None  Radiology No results found.  Procedures Procedures   Medications Ordered in ED Medications  cefTRIAXone (ROCEPHIN) injection 500 mg (500 mg Intramuscular Given 06/10/21 1136)    ED Course/ Medical Decision Making/ A&P                           Medical Decision Making  This patient presents to the ED for concern of STD exposure, this involves an extensive number of treatment options, and is a complaint that carries with it a high risk of complications and morbidity.  The patient was notified from a sexual partner that they tested positive for chlamydia.  Patient has had 2 male sexual partners in the past 6 months, with unprotected intercourse. On exam patient without penile discharge/pain/swelling,  testicular pain/swelling.no concern for prostatitis or epididymitis.  No acute cardiovascular, respiratory, abdominal exam findings.  Differential diagnosis includes epididymitis, gonorrhea, chlamydia, prostatitis.  Due to the positive sexual contact with chlamydia, this is likely due to STD exposure.  Patient treated in the ED for STI with Rocephin IM.  Prescription for doxycycline sent.  Patient advised to inform and treat all sexual partners.  Pt advised on safe sex practices and understands that they have GC/Chlamydia cultures  pending and will result in 2-3 days. Patient did not want additional testing at this time. Pt encouraged to follow up at local health department for future STI checks.   Amount and/or Complexity of Data Reviewed Labs: GC/chlamydia probe, urinalysis   Dispostion: After consideration of the diagnostic results and the patients response to treatment, I feel that the patent would benefit from discharge home with doxycycline prescription. Discussed return precautions. Pt appears safe for discharge. Follow up as indicated in discharge paperwork.    This chart was dictated using voice recognition software, Dragon. Despite the best efforts of this provider to proofread and correct errors, errors may still occur which can change documentation meaning. Final Clinical Impression(s) / ED Diagnoses Final diagnoses:  STD exposure    Rx / DC Orders ED Discharge Orders          Ordered    doxycycline (VIBRAMYCIN) 100 MG capsule  2 times daily        06/10/21 1114              Carter Kaman A, PA-C 06/10/21 1216    Malvin Johns, MD 06/10/21 1524

## 2021-06-10 NOTE — ED Notes (Signed)
AVS reviewed with client, explained medication being given today and Rx also reviewed. Stressed the importance of completing all Rx as prescribed, also discussed safe sex practice, Also provided discussion on use of MyChart

## 2021-06-11 LAB — GC/CHLAMYDIA PROBE AMP (~~LOC~~) NOT AT ARMC
Chlamydia: POSITIVE — AB
Comment: NEGATIVE
Comment: NORMAL
Neisseria Gonorrhea: NEGATIVE

## 2023-01-16 IMAGING — DX DG CHEST 1V PORT
2 series · 2 of 2 positions shown · non-contrast
Comparison: August 17, 2018

CLINICAL DATA: Rales

EXAM:
PORTABLE CHEST 1 VIEW

[chest ap (1 of 2)]
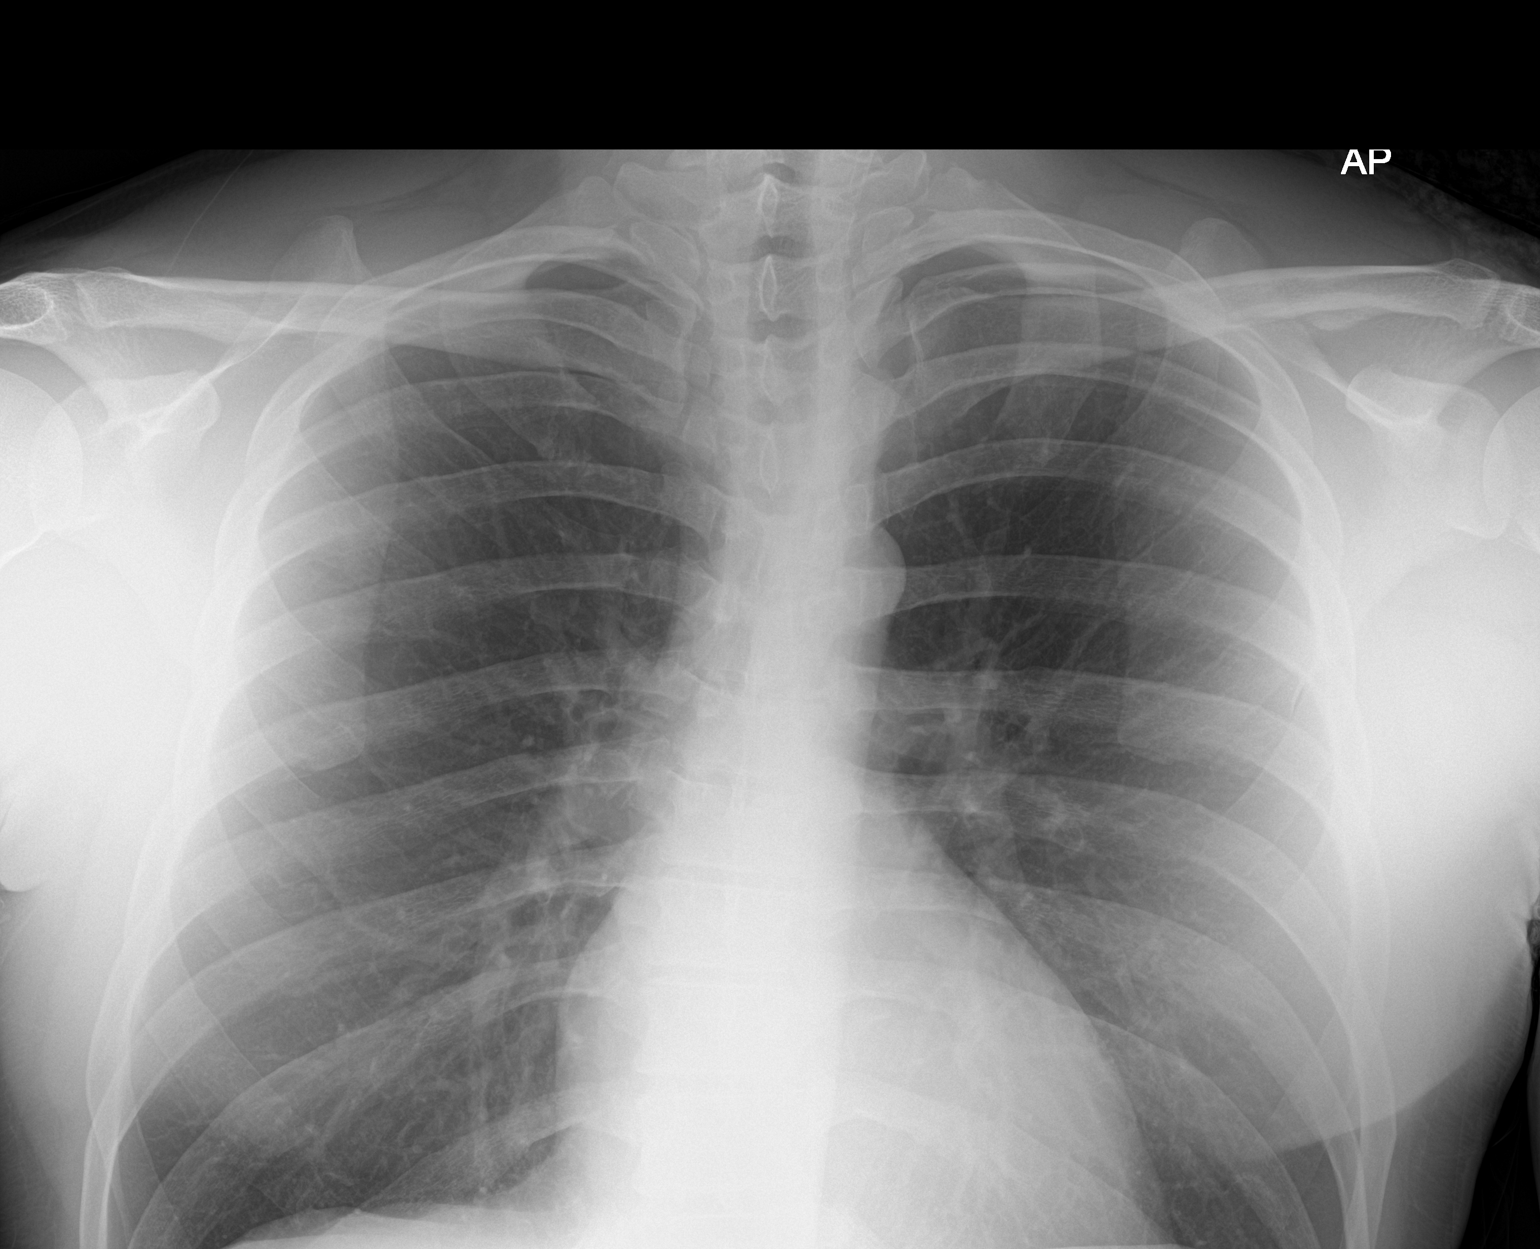

[chest ap (2 of 2)]
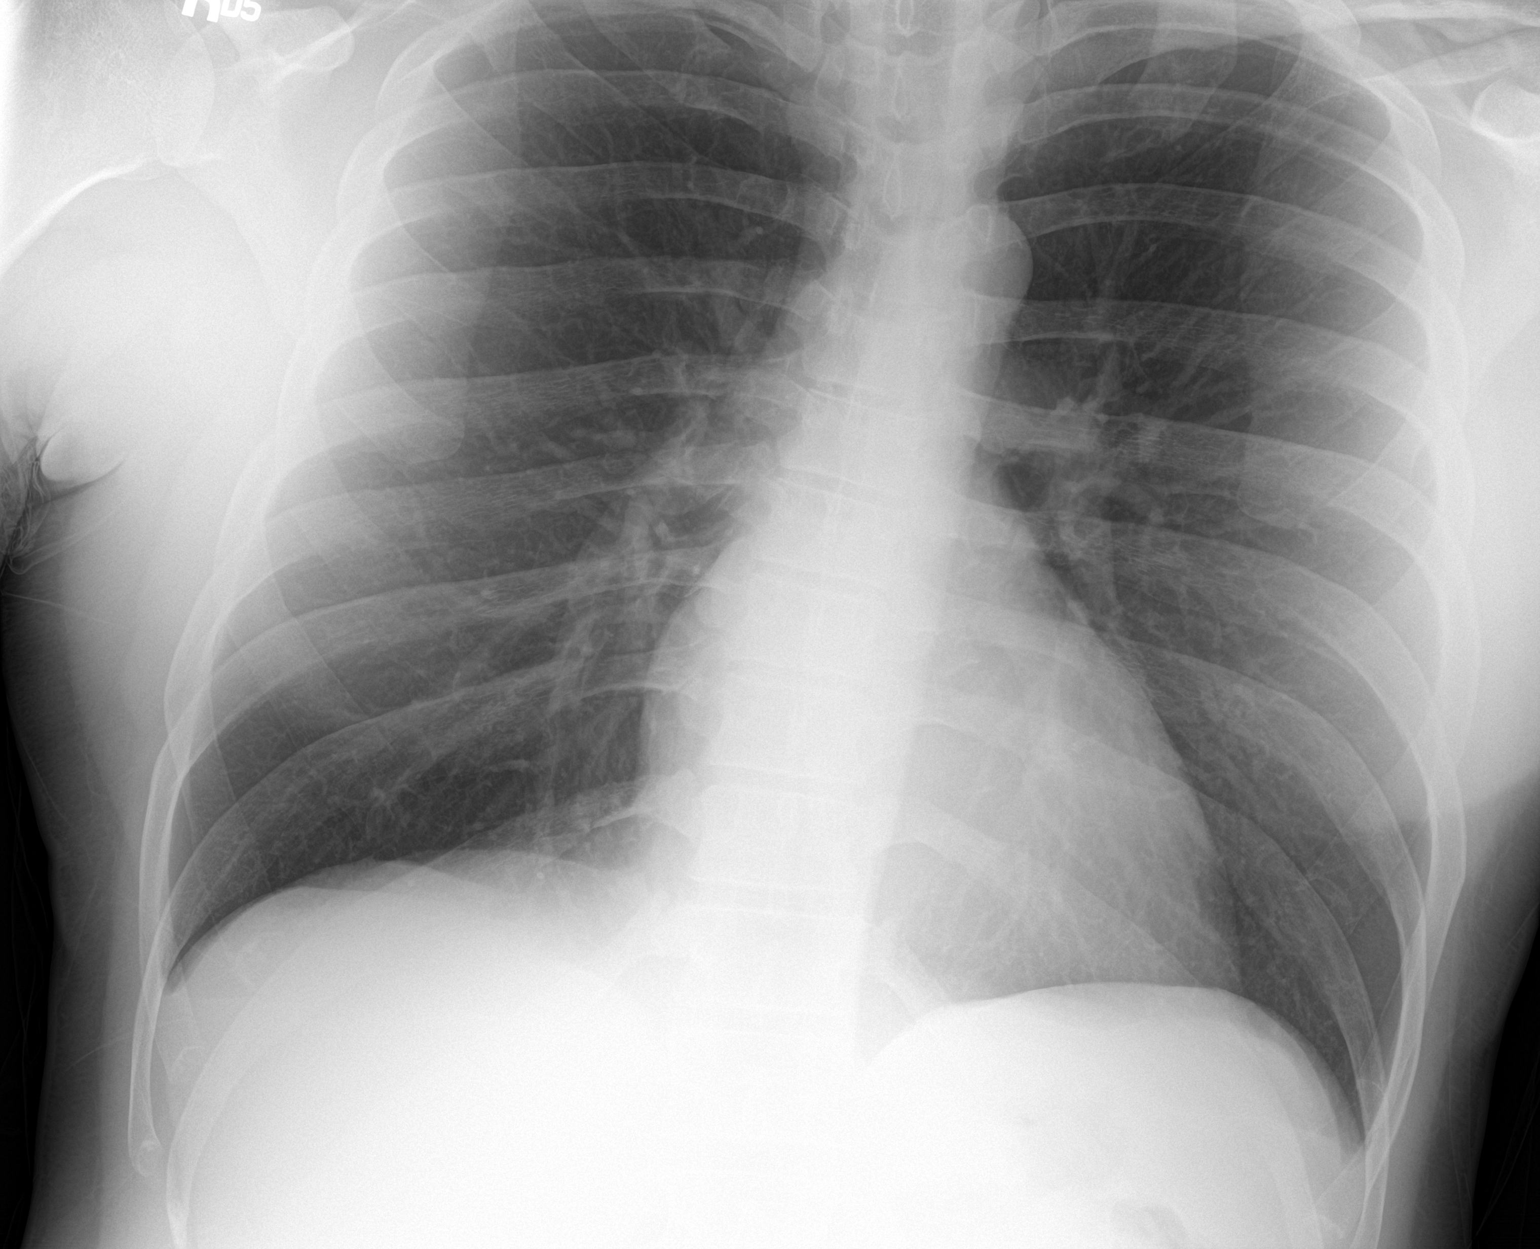

[2 of 2 positions shown; findings below may reference images not displayed]

FINDINGS: Lungs are clear. Heart size and pulmonary vascularity are normal. No
adenopathy. No bone lesions.
IMPRESSION: Lungs clear.  Cardiac silhouette normal.

## 2023-12-06 ENCOUNTER — Encounter (HOSPITAL_COMMUNITY): Payer: Self-pay | Admitting: *Deleted

## 2023-12-06 ENCOUNTER — Emergency Department (HOSPITAL_COMMUNITY)

## 2023-12-06 ENCOUNTER — Emergency Department (HOSPITAL_COMMUNITY)
Admission: EM | Admit: 2023-12-06 | Discharge: 2023-12-06 | Disposition: A | Discharge status: Home / Self Care | Attending: Emergency Medicine | Admitting: Emergency Medicine

## 2023-12-06 ENCOUNTER — Other Ambulatory Visit: Payer: Self-pay

## 2023-12-06 DIAGNOSIS — R0602 Shortness of breath: Secondary | ICD-10-CM | POA: Insufficient documentation

## 2023-12-06 DIAGNOSIS — R079 Chest pain, unspecified: Secondary | ICD-10-CM | POA: Insufficient documentation

## 2023-12-06 DIAGNOSIS — R202 Paresthesia of skin: Secondary | ICD-10-CM | POA: Insufficient documentation

## 2023-12-06 LAB — CBC WITH DIFFERENTIAL/PLATELET
Abs Immature Granulocytes: 0.02 10*3/uL (ref 0.00–0.07)
Basophils Absolute: 0 10*3/uL (ref 0.0–0.1)
Basophils Relative: 0 %
Eosinophils Absolute: 0.1 10*3/uL (ref 0.0–0.5)
Eosinophils Relative: 1 %
HCT: 43.3 % (ref 39.0–52.0)
Hemoglobin: 14.2 g/dL (ref 13.0–17.0)
Immature Granulocytes: 0 %
Lymphocytes Relative: 20 %
Lymphs Abs: 1.2 10*3/uL (ref 0.7–4.0)
MCH: 25.9 pg — ABNORMAL LOW (ref 26.0–34.0)
MCHC: 32.8 g/dL (ref 30.0–36.0)
MCV: 79 fL — ABNORMAL LOW (ref 80.0–100.0)
Monocytes Absolute: 0.4 10*3/uL (ref 0.1–1.0)
Monocytes Relative: 6 %
Neutro Abs: 4.3 10*3/uL (ref 1.7–7.7)
Neutrophils Relative %: 73 %
Platelets: 307 10*3/uL (ref 150–400)
RBC: 5.48 MIL/uL (ref 4.22–5.81)
RDW: 13.7 % (ref 11.5–15.5)
WBC: 6 10*3/uL (ref 4.0–10.5)
nRBC: 0 % (ref 0.0–0.2)

## 2023-12-06 LAB — COMPREHENSIVE METABOLIC PANEL WITH GFR
ALT: 27 U/L (ref 0–44)
AST: 22 U/L (ref 15–41)
Albumin: 4.3 g/dL (ref 3.5–5.0)
Alkaline Phosphatase: 42 U/L (ref 38–126)
Anion gap: 13 (ref 5–15)
BUN: 10 mg/dL (ref 6–20)
CO2: 22 mmol/L (ref 22–32)
Calcium: 9.6 mg/dL (ref 8.9–10.3)
Chloride: 106 mmol/L (ref 98–111)
Creatinine, Ser: 1 mg/dL (ref 0.61–1.24)
GFR, Estimated: >60 mL/min (ref >60)
Glucose, Bld: 107 mg/dL — ABNORMAL HIGH (ref 70–99)
Potassium: 4 mmol/L (ref 3.5–5.1)
Sodium: 141 mmol/L (ref 135–145)
Total Bilirubin: 0.5 mg/dL (ref 0.0–1.2)
Total Protein: 7.4 g/dL (ref 6.5–8.1)

## 2023-12-06 LAB — TROPONIN I (HIGH SENSITIVITY)
Troponin I (High Sensitivity): 4 ng/L (ref <18)
Troponin I (High Sensitivity): 3 ng/L (ref <18)

## 2023-12-06 NOTE — ED Triage Notes (Signed)
 PT arrives via POV. PT reports left sided chest pain that radiates to his left arm since last night. PT reports associated sob and some numbness and tingling to left arm.

## 2023-12-06 NOTE — ED Provider Notes (Signed)
 [place] EMERGENCY DEPARTMENT AT [facility] Provider Note   CSN: [id number] Arrival date & time: 12/06/23  1411     Patient presents with: Chest Pain and Shortness of Breath   BELMONT VALLI is a 38 y.o. male with no significant PMHx who presents with 1 day of chest tightness, intermittent SOB, and left arm paresthesias. Patient states that earlier today while at work, he began to experience chest discomfort he describes as tightness in the mid-left chest, associated with paresthesias of the entire LUE from upper arm to all fingers. He also had mild SOB at the time. No associated N/V, headache, blurred vision, or syncope. He states this has never happened before, but per chart review patient has been seen a few times for similar chest discomfort in years past and had an unremarkable stress test in 2015. Patient does note that he has been under increased stress at work recently, denies history of anxiety/panic disorders.  Patient works in [employer] and does moderate physical activity on the job and was outside in the heat when symptoms began. The symptoms lasted less than an hour, but a few of his symptoms have recurred intermittently since. While in the ED, patient states his chest pain is resolved but he felt the paresthesias again while having his BP checked, and also endorses mild left-sided groin pain. He has no history of inguinal hernia and the groin pain is not worsened by bearing down or by palpation of the groin or the abdomen.     Prior to Admission medications   Medication Sig Start Date End Date Taking? Authorizing Provider  azithromycin  (ZITHROMAX ) 250 MG tablet Take 1 tablet (250 mg total) by mouth daily. Take first 2 tablets together, then 1 every day until finished. 08/17/18   Law, Alexandra M, PA-C  benzonatate  (TESSALON ) 100 MG capsule Take 1 capsule (100 mg total) by mouth 3 (three) times daily as needed for cough. 09/29/20   Sponseller, Pleasant SAUNDERS, PA-C   cetirizine  (ZYRTEC ) 10 MG tablet Take 1 tablet (10 mg total) by mouth daily. 10/26/17   Mitchell, Jessica B, PA-C  fluticasone  (FLONASE ) 50 MCG/ACT nasal spray Place 1 spray into both nostrils daily. 08/17/18   Law, Alexandra M, PA-C  sodium chloride  (OCEAN) 0.65 % SOLN nasal spray Place 1 spray into both nostrils as needed for congestion. 08/17/18   Law, Alexandra M, PA-C    Allergies: Patient has no known allergies.     Updated Vital Signs BP 115/62 (BP Location: Right Arm)   Pulse 61   Temp 98 F (36.7 C) (Oral)   Resp 18   SpO2 100%   Physical Exam Vitals reviewed.  Constitutional:      General: He is not in acute distress.    Appearance: He is well-developed. He is not toxic-appearing or diaphoretic.  HENT:     Head: Normocephalic and atraumatic.  Eyes:     Extraocular Movements: Extraocular movements intact.     Pupils: Pupils are equal, round, and reactive to light.  Cardiovascular:     Rate and Rhythm: Normal rate and regular rhythm.     Pulses:          Carotid pulses are 2+ on the right side and 2+ on the left side.      Radial pulses are 2+ on the right side and 2+ on the left side.       Dorsalis pedis pulses are 2+ on the right side and 2+ on the left side.  Heart sounds: No murmur heard.    No gallop.  Pulmonary:     Effort: Pulmonary effort is normal. No tachypnea or respiratory distress.     Breath sounds: Normal breath sounds.  Chest:     Chest wall: No tenderness.  Abdominal:     Palpations: Abdomen is soft.     Tenderness: There is no abdominal tenderness. There is no guarding or rebound.     Hernia: No hernia is present.  Musculoskeletal:        General: Normal range of motion.     Cervical back: Normal range of motion and neck supple.     Right lower leg: No edema.     Left lower leg: No edema.  Skin:    General: Skin is warm and dry.     Capillary Refill: Capillary refill takes less than 2 seconds.  Neurological:     General: No focal  deficit present.     Mental Status: He is alert and oriented to person, place, and time.     Sensory: No sensory deficit.     Motor: No weakness.  Psychiatric:        Mood and Affect: Mood normal.     (all labs ordered are listed, but only abnormal results are displayed) Labs Reviewed  CBC WITH DIFFERENTIAL/PLATELET - Abnormal; Notable for the following components:      Result Value   MCV 79.0 (*)    MCH 25.9 (*)    All other components within normal limits  COMPREHENSIVE METABOLIC PANEL WITH GFR - Abnormal; Notable for the following components:   Glucose, Bld 107 (*)    All other components within normal limits  TROPONIN I (HIGH SENSITIVITY)  TROPONIN I (HIGH SENSITIVITY)    EKG: EKG Interpretation Date/Time:  Thursday December 06 2023 14:23:13 EDT Ventricular Rate:  71 PR Interval:  174 QRS Duration:  84 QT Interval:  372 QTC Calculation: 404 R Axis:   71  Text Interpretation: Normal sinus rhythm Septal infarct , age undetermined Abnormal ECG Confirmed by Yolande Charleston [phone]) on 12/06/2023 8:16:19 PM  Radiology: DG Chest 2 View Result Date: 12/06/2023 CLINICAL DATA:  Chest pain, SOB EXAM: CHEST - 2 VIEW COMPARISON:  September 29, 2020 FINDINGS: No focal airspace consolidation, pleural effusion, or pneumothorax. No cardiomegaly.No acute fracture or destructive lesion. IMPRESSION: No acute cardiopulmonary abnormality. Electronically Signed   By: Rogelia Myers M.D.   On: 12/06/2023 15:17    Medications Ordered in the ED - No data to display   HEART Score: 2 Geneva (Revised) Score: 0, Geneva Score Interpretation: Low Risk Group: 7-9% incidence of pulmonary embolism from several studies PERC Score: 0, PERC Score Interpretation: No need for further workup, as <2% chance of PE.  If no criteria are positive and clinicians pre-test probability is <15%, PERC Rule criteria are satisfied  Medical Decision Making Amount and/or Complexity of Data Reviewed Labs: ordered. Radiology:  ordered. ECG/medicine tests: ordered.   Patient with no significant PMHx presenting with chest pain/pressure, intermittent SOB, and left arm paresthesias. Onset of symptoms began at work while performing mild-moderate physical activity while outside in the heat, which likely contributed to patient's presentation. He also notes increased stress recently at work.  Differential diagnoses considered include: ACS/MI, angina, costochondritis, pleuritis, GERD, anxiety, nonspecific chest pain, PE, or MSK-related LUE injury such as shoulder sprain/strain. Low concern for PE given PERC negative and Geneva 0. Chest pain is not reproducible and not pleuritic, so less suggestive of MSK-related or  pleurisy, however also reassuring HEART score 2 regarding cardiac etiology.   Regarding his left groin pain, patient has no history of or palpable hernia or deformity, has no focal tenderness to palpation of the left groin, and has no appreciable groin lymphadenopathy on exam. Otherwise no GU symptoms. He has full ROM and strength of the b/l LE and no unilateral leg swelling/edema. Favor the symptoms to be nonspecific or MSK-related, especially given patient's physical activity required of him at work.   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 Labs ordered as above, notable for: unremarkable CBC and CMP, trops 4 -> 3 CXR with no acute cardiopulmonary findings  Discussed reassuring workup with patient. Favor patient's presentation to be non-specific chest pain with no indications of acute cardiac emergency, however due to his symptomology I discussed that patient may need repeat stress testing in the future given that his last stress test was nearly a decade ago. Recommended close PCP follow up and provided strict return precautions. Patient was discharged in stable condition.    Final diagnoses:  Chest pain, unspecified type    ED Dispo: Discharge home      Raoul Rake, MD 12/07/23 1218    Yolande Lamar BROCKS, MD 12/11/23 1100

## 2023-12-06 NOTE — ED Provider Triage Note (Signed)
 Emergency Medicine Provider Triage Evaluation Note  Duane Banks , a 38 y.o. male  was evaluated in triage.  Pt complains of chest pain, shortness of breath.  Patient reports feelings of left-sided chest pain with radiation towards left arm with intermittent episodes of numbness since last night.  Is also had some feelings of shortness of breath and lightheadedness.  No prior history of any cardiac abnormalities but chart review shows history of heart valve regurgitation.  No reported feelings of nausea, vomiting.  Review of Systems  Positive: As above Negative: As above  Physical Exam  BP 134/62 (BP Location: Right Arm)   Pulse 66   Temp 98.3 F (36.8 C)   Resp 18   SpO2 100%  Gen:   Awake, no distress   Resp:  Normal effort  MSK:   Moves extremities without difficulty  Other:    Medical Decision Making  Medically screening exam initiated at 2:57 PM.  Appropriate orders placed.  RUMALDO DIFATTA was informed that the remainder of the evaluation will be completed by another provider, this initial triage assessment does not replace that evaluation, and the importance of remaining in the ED until their evaluation is complete.     Taggert Bozzi A, PA-C 12/06/23 1457

## 2023-12-06 NOTE — Discharge Instructions (Signed)
 You were seen today for chest pain. While you were here we monitored your vitals, preformed a physical exam, and labs/chest xray. These were all reassuring and there is no indication for any further testing or intervention in the emergency department at this time.   Things to do:  - Follow up with your primary care provider within the next 1-2 weeks regarding your symptoms   Return to the emergency department if you have any new or worsening symptoms including worsening chest pain, severe shortness of breath, confusion/altered mental status, or if you have any other serious medical concerns.

## 2023-12-14 ENCOUNTER — Emergency Department (HOSPITAL_BASED_OUTPATIENT_CLINIC_OR_DEPARTMENT_OTHER): Admitting: Radiology

## 2023-12-14 ENCOUNTER — Emergency Department (HOSPITAL_BASED_OUTPATIENT_CLINIC_OR_DEPARTMENT_OTHER)
Admission: EM | Admit: 2023-12-14 | Discharge: 2023-12-14 | Disposition: A | Discharge status: Home / Self Care | Attending: Emergency Medicine | Admitting: Emergency Medicine

## 2023-12-14 ENCOUNTER — Other Ambulatory Visit: Payer: Self-pay

## 2023-12-14 ENCOUNTER — Encounter (HOSPITAL_BASED_OUTPATIENT_CLINIC_OR_DEPARTMENT_OTHER): Payer: Self-pay

## 2023-12-14 DIAGNOSIS — R079 Chest pain, unspecified: Secondary | ICD-10-CM

## 2023-12-14 DIAGNOSIS — R0602 Shortness of breath: Secondary | ICD-10-CM | POA: Diagnosis present

## 2023-12-14 DIAGNOSIS — R0600 Dyspnea, unspecified: Secondary | ICD-10-CM | POA: Insufficient documentation

## 2023-12-14 DIAGNOSIS — R0789 Other chest pain: Secondary | ICD-10-CM | POA: Diagnosis not present

## 2023-12-14 LAB — BASIC METABOLIC PANEL WITH GFR
Anion gap: 12 (ref 5–15)
BUN: 9 mg/dL (ref 6–20)
CO2: 23 mmol/L (ref 22–32)
Calcium: 10.1 mg/dL (ref 8.9–10.3)
Chloride: 105 mmol/L (ref 98–111)
Creatinine, Ser: 1.09 mg/dL (ref 0.61–1.24)
GFR, Estimated: >60 mL/min (ref >60)
Glucose, Bld: 95 mg/dL (ref 70–99)
Potassium: 4.1 mmol/L (ref 3.5–5.1)
Sodium: 139 mmol/L (ref 135–145)

## 2023-12-14 LAB — CBC
HCT: 40.6 % (ref 39.0–52.0)
Hemoglobin: 13.6 g/dL (ref 13.0–17.0)
MCH: 25.7 pg — ABNORMAL LOW (ref 26.0–34.0)
MCHC: 33.5 g/dL (ref 30.0–36.0)
MCV: 76.6 fL — ABNORMAL LOW (ref 80.0–100.0)
Platelets: 302 K/uL (ref 150–400)
RBC: 5.3 MIL/uL (ref 4.22–5.81)
RDW: 13.4 % (ref 11.5–15.5)
WBC: 5.6 K/uL (ref 4.0–10.5)
nRBC: 0 % (ref 0.0–0.2)

## 2023-12-14 LAB — D-DIMER, QUANTITATIVE: D-Dimer, Quant: 0.27 ug{FEU}/mL (ref 0.00–0.50)

## 2023-12-14 LAB — TROPONIN T, HIGH SENSITIVITY: Troponin T High Sensitivity: <15 ng/L (ref <19)

## 2023-12-14 NOTE — Discharge Instructions (Signed)
 Follow-up with a primary care doctor for further evaluation.  I have also placed a referral to a cardiologist for further evaluation.  Return to the ED as needed for worsening symptoms.

## 2023-12-14 NOTE — ED Provider Notes (Signed)
 [place] EMERGENCY DEPARTMENT AT [facility] Provider Note   CSN: [id number] Arrival date & time: 12/14/23  1433     Patient presents with: Shortness of Breath   Duane Banks is a 38 y.o. male.    Shortness of Breath    Patient presents to the ED with complaints of shortness of breath.  Patient states her symptomsStarted over a week ago.  Patient states the episodes are intermittent.  Nothing seems to bring them on in particular.  Sometimes they last up to an hour.  Patient was seen in the emergency room on the third.  He had a workup that did not provide any specific diagnosis.  Patient states he has continued to have some of these episodes.  He will feel short of breath he will feel lightheaded he will have tingling all over.  He will also have some discomfort pressure in his chest.  He has not noticed any leg swelling.  No history of PE DVT.  No history of prior heart disease.  Patient states a friend told him that they had a good experience at this facility so he decided to come here to be rechecked.  Prior to Admission medications   Medication Sig Start Date End Date Taking? Authorizing Provider  azithromycin  (ZITHROMAX ) 250 MG tablet Take 1 tablet (250 mg total) by mouth daily. Take first 2 tablets together, then 1 every day until finished. 08/17/18   Law, Alexandra M, PA-C  benzonatate  (TESSALON ) 100 MG capsule Take 1 capsule (100 mg total) by mouth 3 (three) times daily as needed for cough. 09/29/20   Sponseller, Pleasant R, PA-C  cetirizine  (ZYRTEC ) 10 MG tablet Take 1 tablet (10 mg total) by mouth daily. 10/26/17   Mitchell, Jessica B, PA-C  fluticasone  (FLONASE ) 50 MCG/ACT nasal spray Place 1 spray into both nostrils daily. 08/17/18   Law, Alexandra M, PA-C  sodium chloride  (OCEAN) 0.65 % SOLN nasal spray Place 1 spray into both nostrils as needed for congestion. 08/17/18   Law, Alexandra M, PA-C    Allergies: Patient has no known allergies.    Review of Systems   Respiratory:  Positive for shortness of breath.     Updated Vital Signs BP 127/73   Pulse 64   Temp 97.7 F (36.5 C) (Oral)   Resp 13   Ht 1.88 m (6' 2)   Wt 108.9 kg   SpO2 96%   BMI 30.81 kg/m   Physical Exam Vitals and nursing note reviewed.  Constitutional:      General: He is not in acute distress.    Appearance: He is well-developed.  HENT:     Head: Normocephalic and atraumatic.     Right Ear: External ear normal.     Left Ear: External ear normal.  Eyes:     General: No scleral icterus.       Right eye: No discharge.        Left eye: No discharge.     Conjunctiva/sclera: Conjunctivae normal.  Neck:     Trachea: No tracheal deviation.  Cardiovascular:     Rate and Rhythm: Normal rate and regular rhythm.  Pulmonary:     Effort: Pulmonary effort is normal. No respiratory distress.     Breath sounds: Normal breath sounds. No stridor. No wheezing or rales.  Abdominal:     General: Bowel sounds are normal. There is no distension.     Palpations: Abdomen is soft.     Tenderness: There is no abdominal  tenderness. There is no guarding or rebound.  Musculoskeletal:        General: No tenderness or deformity.     Cervical back: Neck supple.  Skin:    General: Skin is warm and dry.     Findings: No rash.  Neurological:     General: No focal deficit present.     Mental Status: He is alert.     Cranial Nerves: No cranial nerve deficit, dysarthria or facial asymmetry.     Sensory: No sensory deficit.     Motor: No abnormal muscle tone or seizure activity.     Coordination: Coordination normal.  Psychiatric:        Mood and Affect: Mood normal.     (all labs ordered are listed, but only abnormal results are displayed) Labs Reviewed  CBC - Abnormal; Notable for the following components:      Result Value   MCV 76.6 (*)    MCH 25.7 (*)    All other components within normal limits  BASIC METABOLIC PANEL WITH GFR  D-DIMER, QUANTITATIVE  GC/CHLAMYDIA PROBE AMP  ([place]) NOT AT [facility]  TROPONIN T, HIGH SENSITIVITY    EKG: EKG Interpretation Date/Time:  Friday December 14 2023 14:42:43 EDT Ventricular Rate:  88 PR Interval:  170 QRS Duration:  86 QT Interval:  358 QTC Calculation: 433 R Axis:   68  Text Interpretation: Normal sinus rhythm Cannot rule out Anterior infarct (cited on or before 06-Dec-2023) Abnormal ECG When compared with ECG of 06-Dec-2023 14:23, No significant change since last tracing Confirmed by Randol Simmonds [phone]) on 12/14/2023 3:21:05 PM  Radiology: DG Chest 2 View Result Date: 12/14/2023 CLINICAL DATA:  Shortness of breath EXAM: CHEST - 2 VIEW COMPARISON:  Chest radiograph December 06, 2023 FINDINGS: Mild flattening of bilateral hemidiaphragms suggestive of increased lung volumes. Both lungs are clearThe heart size and mediastinal contours are within normal limits. The visualized skeletal structures are unremarkable. 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 No active cardiopulmonary disease. Electronically Signed   By: Megan  Zare M.D.   On: 12/14/2023 16:21     Procedures   Medications Ordered in the ED - No data to display  Clinical Course as of 12/14/23 1728  Fri Dec 14, 2023  1634 Troponin T, High Sensitivity Troponin normal.  D-dimer normal.  Metabolic panel normal.  CBC normal. [JK]  1634 Chest x-ray without acute findings [JK]    Clinical Course User Index [JK] Randol Simmonds, MD                                 Medical Decision Making Amount and/or Complexity of Data Reviewed Labs: ordered. Decision-making details documented in ED Course. Radiology: ordered.   Patient presented to the ED for evaluation of shortness of breath.  This is a recurrent episode for the patient.  ED workup is normal.  He does not have any pneumonia.  No signs of heart injury.  He is not anemic.  No metabolic abnormalities.  Low suspicion for PE.  D-dimer negative.  Low suspicion for ACS and troponin is negative.  EKG  does not show any acute changes.  It is possible symptoms may be related to panic attacks.  Will have him follow-up with PCP and cardiology for further evaluation.  Incidentally patient gave a urine specimen.  While he was being discharged he asked if we could do an STD screening on his  urine     Final diagnoses:  Dyspnea, unspecified type  Chest pain, unspecified type    ED Discharge Orders          Ordered    Ambulatory referral to Cardiology       Comments: If you have not heard from the Cardiology office within the next 72 hours please call [phone].   12/14/23 1721               Randol Simmonds, MD 12/14/23 1728

## 2023-12-14 NOTE — ED Triage Notes (Signed)
 Patient arrives POV with complaints of intermittent shortness of breath x1 week. Patient was seen at another ED for the same a few days ago and did not have any significant findings.

## 2023-12-14 NOTE — ED Notes (Signed)
 DC paperwork given and verbally understood.

## 2023-12-17 LAB — GC/CHLAMYDIA PROBE AMP (~~LOC~~) NOT AT ARMC
Chlamydia: NEGATIVE
Comment: NEGATIVE
Comment: NORMAL
Neisseria Gonorrhea: NEGATIVE

## 2024-06-11 ENCOUNTER — Other Ambulatory Visit: Payer: Self-pay

## 2024-06-11 ENCOUNTER — Emergency Department (HOSPITAL_BASED_OUTPATIENT_CLINIC_OR_DEPARTMENT_OTHER): Admitting: Radiology

## 2024-06-11 ENCOUNTER — Encounter (HOSPITAL_BASED_OUTPATIENT_CLINIC_OR_DEPARTMENT_OTHER): Payer: Self-pay

## 2024-06-11 ENCOUNTER — Other Ambulatory Visit (HOSPITAL_BASED_OUTPATIENT_CLINIC_OR_DEPARTMENT_OTHER): Payer: Self-pay

## 2024-06-11 ENCOUNTER — Emergency Department (HOSPITAL_BASED_OUTPATIENT_CLINIC_OR_DEPARTMENT_OTHER)
Admission: EM | Admit: 2024-06-11 | Discharge: 2024-06-11 | Disposition: A | Discharge status: Home / Self Care | Attending: Emergency Medicine | Admitting: Emergency Medicine

## 2024-06-11 DIAGNOSIS — M25511 Pain in right shoulder: Secondary | ICD-10-CM | POA: Diagnosis present

## 2024-06-11 MED ORDER — OXYCODONE HCL 5 MG PO TABS
5.0000 mg | ORAL_TABLET | Freq: Once | ORAL | Status: AC
  Administered 2024-06-11: 5 mg via ORAL
  Filled 2024-06-11: qty 1

## 2024-06-11 MED ORDER — KETOROLAC TROMETHAMINE 15 MG/ML IJ SOLN
15.0000 mg | Freq: Once | INTRAMUSCULAR | Status: AC
  Administered 2024-06-11: 15 mg via INTRAMUSCULAR
  Filled 2024-06-11: qty 1

## 2024-06-11 MED ORDER — DIAZEPAM 5 MG PO TABS
5.0000 mg | ORAL_TABLET | Freq: Once | ORAL | Status: AC
  Administered 2024-06-11: 5 mg via ORAL
  Filled 2024-06-11: qty 1

## 2024-06-11 MED ORDER — METHOCARBAMOL 500 MG PO TABS
500.0000 mg | ORAL_TABLET | Freq: Two times a day (BID) | ORAL | 0 refills | Status: AC
  Filled 2024-06-11: qty 20, 10d supply, fill #0

## 2024-06-11 MED ORDER — ACETAMINOPHEN 500 MG PO TABS
1000.0000 mg | ORAL_TABLET | Freq: Once | ORAL | Status: AC
  Administered 2024-06-11: 1000 mg via ORAL
  Filled 2024-06-11: qty 2

## 2024-06-11 NOTE — ED Provider Notes (Signed)
 " [place] EMERGENCY DEPARTMENT AT [facility] Provider Note   CSN: [id number] Arrival date & time: 06/11/24  0507     Patient presents with: Shoulder Pain   Duane Banks is a 39 y.o. male.   39 yo M with a chief complaint of right shoulder pain.  The patient says he was at work.  It suddenly started bothering him.  He had progressively worse throughout the day.  Now feels like he has trouble moving it at all or even laying on it in a certain way.  He was told after an injection in the past that he had no cartilage in that joint.  He received a cortisone injection and had some improvement.  This was sometime ago.   Shoulder Pain      Prior to Admission medications  Medication Sig Start Date End Date Taking? Authorizing Provider  methocarbamol  (ROBAXIN ) 500 MG tablet Take 1 tablet (500 mg total) by mouth 2 (two) times daily. 06/11/24  Yes Emil Share, DO  azithromycin  (ZITHROMAX ) 250 MG tablet Take 1 tablet (250 mg total) by mouth daily. Take first 2 tablets together, then 1 every day until finished. 08/17/18   Law, Alexandra M, PA-C  benzonatate  (TESSALON ) 100 MG capsule Take 1 capsule (100 mg total) by mouth 3 (three) times daily as needed for cough. 09/29/20   Sponseller, Pleasant R, PA-C  cetirizine  (ZYRTEC ) 10 MG tablet Take 1 tablet (10 mg total) by mouth daily. 10/26/17   Mitchell, Jessica B, PA-C  fluticasone  (FLONASE ) 50 MCG/ACT nasal spray Place 1 spray into both nostrils daily. 08/17/18   Law, Alexandra M, PA-C  sodium chloride  (OCEAN) 0.65 % SOLN nasal spray Place 1 spray into both nostrils as needed for congestion. 08/17/18   Law, Alexandra M, PA-C    Allergies: Patient has no known allergies.    Review of Systems  Updated Vital Signs BP 139/77   Pulse 68   Temp 98.2 F (36.8 C) (Oral)   Resp 18   SpO2 100%   Physical Exam Vitals and nursing note reviewed.  Constitutional:      Appearance: He is well-developed.  HENT:     Head: Normocephalic and  atraumatic.  Eyes:     Pupils: Pupils are equal, round, and reactive to light.  Neck:     Vascular: No JVD.  Cardiovascular:     Rate and Rhythm: Normal rate and regular rhythm.     Heart sounds: No murmur heard.    No friction rub. No gallop.  Pulmonary:     Effort: No respiratory distress.     Breath sounds: No wheezing.  Abdominal:     General: There is no distension.     Tenderness: There is no abdominal tenderness. There is no guarding or rebound.  Musculoskeletal:        General: Normal range of motion.     Cervical back: Normal range of motion and neck supple.     Comments: Pulse motor and sensation intact to the right upper extremity.  No obvious pain at the elbow or the wrist or the forearm.  No obvious pain along the clavicle at the [facility] joint or the scapula.  No significant pain along the biceps tendon.  Difficult performing range of motion exercises due to discomfort.  Has pain with palpation mostly along the deltoid.  Sensation to light touch intact along the deltoid.  Skin:    Coloration: Skin is not pale.     Findings: No rash.  Neurological:     Mental Status: He is alert and oriented to person, place, and time.  Psychiatric:        Behavior: Behavior normal.     (all labs ordered are listed, but only abnormal results are displayed) Labs Reviewed - No data to display  EKG: None  Radiology: DG Shoulder Right Port Result Date: 06/11/2024 CLINICAL DATA:  Right-sided shoulder pain. EXAM: RIGHT SHOULDER - 1 VIEW COMPARISON:  None Available. FINDINGS: No evidence for an acute fracture. No subluxation or dislocation. No shoulder separation. Sequelae of calcific tendinitis of the rotator cuff evident. IMPRESSION: 1. No acute bony findings. 2. Calcific tendinitis of the rotator cuff. Electronically Signed   By: Camellia Candle M.D.   On: 06/11/2024 05:49     Procedures   Medications Ordered in the ED  acetaminophen  (TYLENOL ) tablet 1,000 mg (1,000 mg Oral Given 06/11/24  0545)  ketorolac  (TORADOL ) 15 MG/ML injection 15 mg (15 mg Intramuscular Given 06/11/24 0545)  oxyCODONE  (Oxy IR/ROXICODONE ) immediate release tablet 5 mg (5 mg Oral Given 06/11/24 0546)  diazepam  (VALIUM ) tablet 5 mg (5 mg Oral Given 06/11/24 0546)                                    Medical Decision Making Amount and/or Complexity of Data Reviewed Radiology: ordered.  Risk OTC drugs. Prescription drug management.   39 yo M with a chief complaint of right shoulder pain.  Atraumatic.  Difficult to range the shoulder due to discomfort.  I think less likely to be septic arthritis by history.  No obvious erythema or warmth.  No fevers.  Will place in a sling for comfort.  Tylenol  and NSAIDs.  Orthopedic follow-up.  Plain film of the shoulder my independent interpretation without obvious fracture or dislocation.  7:03 AM:  I have discussed the diagnosis/risks/treatment options with the patient and family.  Evaluation and diagnostic testing in the emergency department does not suggest an emergent condition requiring admission or immediate intervention beyond what has been performed at this time.  They will follow up with Ortho, PCP. We also discussed returning to the ED immediately if new or worsening sx occur. We discussed the sx which are most concerning (e.g., sudden worsening pain, fever, inability to tolerate by mouth) that necessitate immediate return. Medications administered to the patient during their visit and any new prescriptions provided to the patient are listed below.  Medications given during this visit Medications  acetaminophen  (TYLENOL ) tablet 1,000 mg (1,000 mg Oral Given 06/11/24 0545)  ketorolac  (TORADOL ) 15 MG/ML injection 15 mg (15 mg Intramuscular Given 06/11/24 0545)  oxyCODONE  (Oxy IR/ROXICODONE ) immediate release tablet 5 mg (5 mg Oral Given 06/11/24 0546)  diazepam  (VALIUM ) tablet 5 mg (5 mg Oral Given 06/11/24 0546)     The patient appears reasonably screen and/or  stabilized for discharge and I doubt any other medical condition or other [facility] requiring further screening, evaluation, or treatment in the ED at this time prior to discharge.       Final diagnoses:  Acute pain of right shoulder    ED Discharge Orders          Ordered    methocarbamol  (ROBAXIN ) 500 MG tablet  2 times daily        06/11/24 0538               Emil Share, DO 06/11/24 0703  "

## 2024-06-11 NOTE — Discharge Instructions (Signed)
 Take 4 over the counter ibuprofen  tablets 3 times a day or 2 over-the-counter naproxen tablets twice a day for pain. Also take tylenol  1000mg (2 extra strength) four times a day.   Take your arm out of the sling at least 4 times a day and perform ROM exercises.  Please call the ortho doc to set up an appointment to be seen in the office.

## 2024-06-11 NOTE — ED Triage Notes (Signed)
 Pt presents via POV c/o right sided shoulder pain. Denies specific injury.

## 2024-06-12 ENCOUNTER — Other Ambulatory Visit (HOSPITAL_BASED_OUTPATIENT_CLINIC_OR_DEPARTMENT_OTHER): Payer: Self-pay

## 2024-06-19 ENCOUNTER — Encounter (HOSPITAL_BASED_OUTPATIENT_CLINIC_OR_DEPARTMENT_OTHER): Payer: Self-pay

## 2024-06-20 ENCOUNTER — Encounter (HOSPITAL_BASED_OUTPATIENT_CLINIC_OR_DEPARTMENT_OTHER): Payer: Self-pay | Admitting: Cardiology

## 2024-06-20 ENCOUNTER — Ambulatory Visit (HOSPITAL_BASED_OUTPATIENT_CLINIC_OR_DEPARTMENT_OTHER): Admitting: Cardiology

## 2024-06-20 ENCOUNTER — Encounter (HOSPITAL_BASED_OUTPATIENT_CLINIC_OR_DEPARTMENT_OTHER): Admitting: Cardiology

## 2024-06-20 ENCOUNTER — Other Ambulatory Visit (HOSPITAL_BASED_OUTPATIENT_CLINIC_OR_DEPARTMENT_OTHER)

## 2024-06-20 VITALS — BP 112/60 | HR 70 | Ht 74.0 in | Wt 224.2 lb

## 2024-06-20 DIAGNOSIS — R002 Palpitations: Secondary | ICD-10-CM

## 2024-06-20 DIAGNOSIS — Z7189 Other specified counseling: Secondary | ICD-10-CM | POA: Diagnosis not present

## 2024-06-20 DIAGNOSIS — Z136 Encounter for screening for cardiovascular disorders: Secondary | ICD-10-CM | POA: Diagnosis not present

## 2024-06-20 DIAGNOSIS — R0602 Shortness of breath: Secondary | ICD-10-CM

## 2024-06-20 NOTE — Patient Instructions (Signed)
 Medication Instructions:  No changes *If you need a refill on your cardiac medications before your next appointment, please call your pharmacy*  Lab Work: Today: lipid panel  If you have labs (blood work) drawn today and your tests are completely normal, you will receive your results only by: MyChart Message (if you have MyChart) OR A paper copy in the mail If you have any lab test that is abnormal or we need to change your treatment, we will call you to review the results.  Testing/Procedures: 14 day Zio monitor  Follow-Up: To be decided based on results

## 2024-06-20 NOTE — Progress Notes (Signed)
 " Cardiology Office Note:  .   Date:  06/20/2024  ID:  Duane Banks, DOB 1985/09/23, MRN [id number] PCP: Freddrick Johns  [facility] Providers Cardiologist:  Shelda Bruckner, MD {  History of Present Illness: .   Duane Banks is a 39 y.o. male without prior cardiac history seen as a new patient consultation at the request of Dr. Randol for chest pain and shortness of breath.   Referral from 12/14/23 from Dr. Randol (ER) reviewed. Referred for chest pain, shortness of breath. ER workup reviewed.  Today: Starting July of 2025, felt like his heart was beating funny, felt like he might pass out, left arm was numb. Came to the ER. Got to the point where he felt like it was beating abnormally every other day. Had some shortness of breath with these episodes. ER workups were unremarkable.   He changed his diet, felt a little better after this. Was eating kale salad, more vegetables. More recently, he has not felt like he is going to pass. Still feels abnormal beats, lasts a few seconds, about every other day. Nothing seems to make these better or worse. Shortness of breath happens after eats certain food, mostly greasy/fried food. Has not had any chest pain.   Quit smoking 2012. 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 Saw cardiology in 2015, had stress test, was normal  FH: grandmother had heart disease, had open heart surgery, lived to 15 years old. Mother just died of cancer, did not have heart disease, did have hypertension.  ROS: Denies chest pain, shortness of breath with normal exertion. No PND, orthopnea, LE edema or unexpected weight gain. No syncope. ROS otherwise negative except as noted.   Studies Reviewed: SABRA    EKG:       Physical Exam:   VS:  BP 112/60   Pulse 70   Ht 6' 2 (1.88 m)   Wt 224 lb 3.2 oz (101.7 kg)   SpO2 98%   BMI 28.79 kg/m    Wt Readings from Last 3 Encounters:  06/20/24 224 lb 3.2 oz (101.7 kg)   12/14/23 240 lb (108.9 kg)  06/10/21 230 lb (104.3 kg)    GEN: Well nourished, well developed in no acute distress HEENT: Normal, moist mucous membranes NECK: No JVD CARDIAC: regular rhythm, normal S1 and S2, no rubs or gallops. No murmur. VASCULAR: Radial and DP pulses 2+ bilaterally. No carotid bruits RESPIRATORY:  Clear to auscultation without rales, wheezing or rhonchi  ABDOMEN: Soft, non-tender, non-distended MUSCULOSKELETAL:  Ambulates independently SKIN: Warm and dry, no edema NEUROLOGIC:  Alert and oriented x 3. No focal neuro deficits noted. PSYCHIATRIC:  Normal affect    ASSESSMENT AND PLAN: .    Palpitations Intermittent shortness of breath -ER workup has been unremarkable -discussed watching symptoms, monitoring at home with Apple watch or Kardia mobile, or getting monitor -after discussed, he would like to do 2 week Zio -if abnormal, will get echo -has had normal ETT in the past -reviewed red flag symptoms that need immediate medical attention  CV risk counseling and prevention -recommend heart healthy/Mediterranean diet, with whole grains, fruits, vegetable, fish, lean meats, nuts, and olive oil. Limit salt. -recommend moderate walking, 3-5 times/week for 30-50 minutes each session. Aim for at least 150 minutes/week. Goal should be pace of 3 miles/hours, or walking 1.5 miles in 30 minutes -recommend avoidance of tobacco products. Avoid excess alcohol. -needs lipid  screening  Dispo: to be determined based on results of testing  Signed, Shelda Bruckner, MD   Shelda Bruckner, MD, PhD, [facility] [place]  [facility]  [place]  Heart & Vascular at [facility] at [facility] [address] [place], [place] [address] [phone]   "
